# Patient Record
Sex: Female | Born: 1980 | Race: Black or African American | Hispanic: No | Marital: Single | Smoking: Current every day smoker
Health system: Southern US, Community
[De-identification: ages and names within clinical notes are randomized; demographics above are authoritative.]

## PROBLEM LIST (undated history)

## (undated) ENCOUNTER — Inpatient Hospital Stay (HOSPITAL_COMMUNITY): Payer: Self-pay

## (undated) DIAGNOSIS — R0602 Shortness of breath: Secondary | ICD-10-CM

## (undated) DIAGNOSIS — R87619 Unspecified abnormal cytological findings in specimens from cervix uteri: Secondary | ICD-10-CM

## (undated) DIAGNOSIS — A599 Trichomoniasis, unspecified: Secondary | ICD-10-CM

## (undated) DIAGNOSIS — Z87440 Personal history of urinary (tract) infections: Secondary | ICD-10-CM

## (undated) DIAGNOSIS — IMO0002 Reserved for concepts with insufficient information to code with codable children: Secondary | ICD-10-CM

## (undated) DIAGNOSIS — F32A Depression, unspecified: Secondary | ICD-10-CM

## (undated) DIAGNOSIS — O24919 Unspecified diabetes mellitus in pregnancy, unspecified trimester: Secondary | ICD-10-CM

## (undated) DIAGNOSIS — F329 Major depressive disorder, single episode, unspecified: Secondary | ICD-10-CM

---
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
## 2001-09-16 ENCOUNTER — Emergency Department (HOSPITAL_COMMUNITY): Admission: EM | Admit: 2001-09-16 | Discharge: 2001-09-16 | Payer: Self-pay | Admitting: Emergency Medicine

## 2001-09-17 ENCOUNTER — Inpatient Hospital Stay (HOSPITAL_COMMUNITY): Admission: AD | Admit: 2001-09-17 | Discharge: 2001-09-17 | Payer: Self-pay | Admitting: Obstetrics and Gynecology

## 2001-09-20 ENCOUNTER — Inpatient Hospital Stay (HOSPITAL_COMMUNITY): Admission: AD | Admit: 2001-09-20 | Discharge: 2001-09-20 | Payer: Self-pay | Admitting: *Deleted

## 2001-09-27 ENCOUNTER — Observation Stay (HOSPITAL_COMMUNITY): Admission: AD | Admit: 2001-09-27 | Discharge: 2001-09-28 | Payer: Self-pay | Admitting: *Deleted

## 2001-09-27 ENCOUNTER — Encounter: Payer: Self-pay | Admitting: *Deleted

## 2001-10-09 ENCOUNTER — Inpatient Hospital Stay (HOSPITAL_COMMUNITY): Admission: AD | Admit: 2001-10-09 | Discharge: 2001-10-09 | Payer: Self-pay | Admitting: Obstetrics and Gynecology

## 2001-12-22 ENCOUNTER — Ambulatory Visit (HOSPITAL_COMMUNITY): Admission: RE | Admit: 2001-12-22 | Discharge: 2001-12-22 | Payer: Self-pay | Admitting: *Deleted

## 2002-01-16 ENCOUNTER — Ambulatory Visit (HOSPITAL_COMMUNITY): Admission: RE | Admit: 2002-01-16 | Discharge: 2002-01-16 | Payer: Self-pay | Admitting: *Deleted

## 2002-04-16 ENCOUNTER — Inpatient Hospital Stay (HOSPITAL_COMMUNITY): Admission: AD | Admit: 2002-04-16 | Discharge: 2002-04-16 | Payer: Self-pay | Admitting: *Deleted

## 2002-05-10 ENCOUNTER — Inpatient Hospital Stay (HOSPITAL_COMMUNITY): Admission: AD | Admit: 2002-05-10 | Discharge: 2002-05-10 | Payer: Self-pay | Admitting: Family Medicine

## 2002-05-16 ENCOUNTER — Inpatient Hospital Stay (HOSPITAL_COMMUNITY): Admission: AD | Admit: 2002-05-16 | Discharge: 2002-05-16 | Payer: Self-pay | Admitting: Family Medicine

## 2002-05-16 ENCOUNTER — Inpatient Hospital Stay (HOSPITAL_COMMUNITY): Admission: AD | Admit: 2002-05-16 | Discharge: 2002-05-19 | Payer: Self-pay | Admitting: *Deleted

## 2002-05-17 ENCOUNTER — Encounter (INDEPENDENT_AMBULATORY_CARE_PROVIDER_SITE_OTHER): Payer: Self-pay | Admitting: Specialist

## 2002-08-20 ENCOUNTER — Emergency Department (HOSPITAL_COMMUNITY): Admission: EM | Admit: 2002-08-20 | Discharge: 2002-08-20 | Payer: Self-pay | Admitting: Emergency Medicine

## 2002-09-07 ENCOUNTER — Inpatient Hospital Stay (HOSPITAL_COMMUNITY): Admission: AD | Admit: 2002-09-07 | Discharge: 2002-09-07 | Payer: Self-pay | Admitting: *Deleted

## 2002-09-12 ENCOUNTER — Inpatient Hospital Stay (HOSPITAL_COMMUNITY): Admission: AD | Admit: 2002-09-12 | Discharge: 2002-09-12 | Payer: Self-pay | Admitting: *Deleted

## 2003-05-27 ENCOUNTER — Emergency Department (HOSPITAL_COMMUNITY): Admission: EM | Admit: 2003-05-27 | Discharge: 2003-05-27 | Payer: Self-pay | Admitting: Emergency Medicine

## 2003-07-25 ENCOUNTER — Emergency Department (HOSPITAL_COMMUNITY): Admission: EM | Admit: 2003-07-25 | Discharge: 2003-07-25 | Payer: Self-pay | Admitting: *Deleted

## 2003-08-27 ENCOUNTER — Inpatient Hospital Stay (HOSPITAL_COMMUNITY): Admission: AD | Admit: 2003-08-27 | Discharge: 2003-08-27 | Payer: Self-pay | Admitting: *Deleted

## 2003-10-19 ENCOUNTER — Emergency Department (HOSPITAL_COMMUNITY): Admission: EM | Admit: 2003-10-19 | Discharge: 2003-10-19 | Payer: Self-pay | Admitting: Family Medicine

## 2004-03-13 ENCOUNTER — Inpatient Hospital Stay (HOSPITAL_COMMUNITY): Admission: AD | Admit: 2004-03-13 | Discharge: 2004-03-13 | Payer: Self-pay | Admitting: Obstetrics and Gynecology

## 2004-04-21 ENCOUNTER — Emergency Department (HOSPITAL_COMMUNITY): Admission: EM | Admit: 2004-04-21 | Discharge: 2004-04-21 | Payer: Self-pay | Admitting: Family Medicine

## 2004-09-12 ENCOUNTER — Emergency Department (HOSPITAL_COMMUNITY): Admission: EM | Admit: 2004-09-12 | Discharge: 2004-09-12 | Payer: Self-pay | Admitting: Emergency Medicine

## 2004-10-14 ENCOUNTER — Emergency Department (HOSPITAL_COMMUNITY): Admission: EM | Admit: 2004-10-14 | Discharge: 2004-10-14 | Payer: Self-pay | Admitting: Emergency Medicine

## 2005-02-05 ENCOUNTER — Emergency Department (HOSPITAL_COMMUNITY): Admission: EM | Admit: 2005-02-05 | Discharge: 2005-02-05 | Payer: Self-pay | Admitting: Emergency Medicine

## 2005-07-25 ENCOUNTER — Emergency Department (HOSPITAL_COMMUNITY): Admission: EM | Admit: 2005-07-25 | Discharge: 2005-07-25 | Payer: Self-pay | Admitting: Emergency Medicine

## 2005-12-21 ENCOUNTER — Emergency Department (HOSPITAL_COMMUNITY): Admission: EM | Admit: 2005-12-21 | Discharge: 2005-12-21 | Payer: Self-pay | Admitting: *Deleted

## 2006-04-29 ENCOUNTER — Emergency Department (HOSPITAL_COMMUNITY): Admission: EM | Admit: 2006-04-29 | Discharge: 2006-04-29 | Payer: Self-pay | Admitting: Emergency Medicine

## 2006-10-25 ENCOUNTER — Emergency Department (HOSPITAL_COMMUNITY): Admission: EM | Admit: 2006-10-25 | Discharge: 2006-10-25 | Payer: Self-pay | Admitting: Emergency Medicine

## 2007-04-06 ENCOUNTER — Emergency Department (HOSPITAL_COMMUNITY): Admission: EM | Admit: 2007-04-06 | Discharge: 2007-04-06 | Payer: Self-pay | Admitting: Emergency Medicine

## 2007-04-17 DIAGNOSIS — O24919 Unspecified diabetes mellitus in pregnancy, unspecified trimester: Secondary | ICD-10-CM

## 2007-04-17 HISTORY — DX: Unspecified diabetes mellitus in pregnancy, unspecified trimester: O24.919

## 2007-04-20 ENCOUNTER — Inpatient Hospital Stay (HOSPITAL_COMMUNITY): Admission: AD | Admit: 2007-04-20 | Discharge: 2007-04-20 | Payer: Self-pay | Admitting: Obstetrics and Gynecology

## 2007-06-06 ENCOUNTER — Inpatient Hospital Stay (HOSPITAL_COMMUNITY): Admission: AD | Admit: 2007-06-06 | Discharge: 2007-06-06 | Payer: Self-pay | Admitting: Obstetrics & Gynecology

## 2007-06-18 ENCOUNTER — Ambulatory Visit (HOSPITAL_COMMUNITY): Admission: RE | Admit: 2007-06-18 | Discharge: 2007-06-18 | Payer: Self-pay | Admitting: Obstetrics and Gynecology

## 2007-08-28 ENCOUNTER — Ambulatory Visit (HOSPITAL_COMMUNITY): Admission: RE | Admit: 2007-08-28 | Discharge: 2007-08-28 | Payer: Self-pay | Admitting: Family Medicine

## 2007-08-31 ENCOUNTER — Ambulatory Visit: Payer: Self-pay | Admitting: Obstetrics and Gynecology

## 2007-08-31 ENCOUNTER — Inpatient Hospital Stay (HOSPITAL_COMMUNITY): Admission: AD | Admit: 2007-08-31 | Discharge: 2007-08-31 | Payer: Self-pay | Admitting: Obstetrics and Gynecology

## 2007-10-30 ENCOUNTER — Ambulatory Visit (HOSPITAL_COMMUNITY): Admission: RE | Admit: 2007-10-30 | Discharge: 2007-10-30 | Payer: Self-pay | Admitting: Family Medicine

## 2007-11-13 ENCOUNTER — Ambulatory Visit (HOSPITAL_COMMUNITY): Admission: RE | Admit: 2007-11-13 | Discharge: 2007-11-13 | Payer: Self-pay | Admitting: Obstetrics & Gynecology

## 2007-11-17 ENCOUNTER — Inpatient Hospital Stay (HOSPITAL_COMMUNITY): Admission: RE | Admit: 2007-11-17 | Discharge: 2007-11-20 | Payer: Self-pay | Admitting: Family Medicine

## 2007-11-17 ENCOUNTER — Ambulatory Visit: Payer: Self-pay | Admitting: Family Medicine

## 2008-04-26 ENCOUNTER — Emergency Department (HOSPITAL_COMMUNITY): Admission: EM | Admit: 2008-04-26 | Discharge: 2008-04-26 | Payer: Self-pay | Admitting: Emergency Medicine

## 2008-09-10 ENCOUNTER — Emergency Department (HOSPITAL_COMMUNITY): Admission: EM | Admit: 2008-09-10 | Discharge: 2008-09-10 | Payer: Self-pay | Admitting: Emergency Medicine

## 2009-03-27 ENCOUNTER — Emergency Department (HOSPITAL_COMMUNITY): Admission: EM | Admit: 2009-03-27 | Discharge: 2009-03-27 | Payer: Self-pay | Admitting: Emergency Medicine

## 2009-05-12 ENCOUNTER — Inpatient Hospital Stay (HOSPITAL_COMMUNITY): Admission: AD | Admit: 2009-05-12 | Discharge: 2009-05-12 | Payer: Self-pay | Admitting: Obstetrics & Gynecology

## 2009-05-12 ENCOUNTER — Ambulatory Visit: Payer: Self-pay | Admitting: Obstetrics & Gynecology

## 2009-05-14 ENCOUNTER — Emergency Department (HOSPITAL_COMMUNITY): Admission: EM | Admit: 2009-05-14 | Discharge: 2009-05-14 | Payer: Self-pay | Admitting: Emergency Medicine

## 2010-02-03 ENCOUNTER — Emergency Department (HOSPITAL_COMMUNITY): Admission: EM | Admit: 2010-02-03 | Discharge: 2010-02-03 | Payer: Self-pay | Admitting: Emergency Medicine

## 2010-05-03 ENCOUNTER — Inpatient Hospital Stay (HOSPITAL_COMMUNITY)
Admission: AD | Admit: 2010-05-03 | Discharge: 2010-05-03 | Payer: Self-pay | Source: Home / Self Care | Attending: Obstetrics and Gynecology | Admitting: Obstetrics and Gynecology

## 2010-05-08 LAB — URINALYSIS, ROUTINE W REFLEX MICROSCOPIC
Hgb urine dipstick: NEGATIVE
Nitrite: NEGATIVE
Protein, ur: NEGATIVE mg/dL
Specific Gravity, Urine: >1.03 — ABNORMAL HIGH (ref 1.005–1.030)
Urobilinogen, UA: 1 mg/dL (ref 0.0–1.0)

## 2010-05-08 LAB — URINE MICROSCOPIC-ADD ON

## 2010-06-28 LAB — DIFFERENTIAL
Basophils Relative: 0 % (ref 0–1)
Eosinophils Absolute: 0 10*3/uL (ref 0.0–0.7)
Eosinophils Relative: 0 % (ref 0–5)
Monocytes Absolute: 0.7 10*3/uL (ref 0.1–1.0)
Monocytes Relative: 7 % (ref 3–12)
Neutrophils Relative %: 77 % (ref 43–77)

## 2010-06-28 LAB — URINALYSIS, ROUTINE W REFLEX MICROSCOPIC
Nitrite: NEGATIVE
Protein, ur: NEGATIVE mg/dL
Specific Gravity, Urine: 1.019 (ref 1.005–1.030)
Urobilinogen, UA: 2 mg/dL — ABNORMAL HIGH (ref 0.0–1.0)
pH: 6.5 (ref 5.0–8.0)

## 2010-06-28 LAB — COMPREHENSIVE METABOLIC PANEL
ALT: 40 U/L — ABNORMAL HIGH (ref 0–35)
Albumin: 3.5 g/dL (ref 3.5–5.2)
Alkaline Phosphatase: 70 U/L (ref 39–117)
Glucose, Bld: 96 mg/dL (ref 70–99)
Potassium: 3.3 mEq/L — ABNORMAL LOW (ref 3.5–5.1)
Sodium: 138 mEq/L (ref 135–145)
Total Protein: 7.2 g/dL (ref 6.0–8.3)

## 2010-06-28 LAB — URINE CULTURE
Colony Count: >=100000
Culture  Setup Time: 201110211433

## 2010-06-28 LAB — WET PREP, GENITAL

## 2010-06-28 LAB — CBC
HCT: 36.8 % (ref 36.0–46.0)
RDW: 12.2 % (ref 11.5–15.5)
WBC: 10.3 10*3/uL (ref 4.0–10.5)

## 2010-06-28 LAB — URINE MICROSCOPIC-ADD ON

## 2010-06-28 LAB — GC/CHLAMYDIA PROBE AMP, GENITAL: Chlamydia, DNA Probe: NEGATIVE

## 2010-07-02 LAB — URINALYSIS, ROUTINE W REFLEX MICROSCOPIC
Bilirubin Urine: NEGATIVE
Bilirubin Urine: NEGATIVE
Glucose, UA: NEGATIVE mg/dL
Hgb urine dipstick: NEGATIVE
Ketones, ur: NEGATIVE mg/dL
Protein, ur: NEGATIVE mg/dL
Protein, ur: NEGATIVE mg/dL
Urobilinogen, UA: 0.2 mg/dL (ref 0.0–1.0)
Urobilinogen, UA: 0.2 mg/dL (ref 0.0–1.0)

## 2010-07-02 LAB — COMPREHENSIVE METABOLIC PANEL
AST: 22 U/L (ref 0–37)
Albumin: 3.6 g/dL (ref 3.5–5.2)
Alkaline Phosphatase: 65 U/L (ref 39–117)
BUN: 10 mg/dL (ref 6–23)
CO2: 28 mEq/L (ref 19–32)
Chloride: 102 mEq/L (ref 96–112)
Creatinine, Ser: 0.7 mg/dL (ref 0.4–1.2)
GFR calc Af Amer: >60 mL/min (ref >60)
GFR calc non Af Amer: >60 mL/min (ref >60)
Potassium: 4.4 mEq/L (ref 3.5–5.1)
Total Bilirubin: 0.7 mg/dL (ref 0.3–1.2)

## 2010-07-02 LAB — LIPASE, BLOOD: Lipase: 25 U/L (ref 11–59)

## 2010-07-02 LAB — CBC
Hemoglobin: 12.2 g/dL (ref 12.0–15.0)
MCHC: 34 g/dL (ref 30.0–36.0)
MCV: 95.7 fL (ref 78.0–100.0)
RDW: 12.5 % (ref 11.5–15.5)

## 2010-07-02 LAB — URINE MICROSCOPIC-ADD ON

## 2010-07-02 LAB — DIFFERENTIAL
Basophils Absolute: 0 10*3/uL (ref 0.0–0.1)
Basophils Relative: 0 % (ref 0–1)
Eosinophils Relative: 0 % (ref 0–5)
Lymphocytes Relative: 22 % (ref 12–46)
Monocytes Absolute: 0.6 10*3/uL (ref 0.1–1.0)

## 2010-07-02 LAB — GC/CHLAMYDIA PROBE AMP, GENITAL
Chlamydia, DNA Probe: POSITIVE — AB
GC Probe Amp, Genital: POSITIVE — AB

## 2010-07-02 LAB — WET PREP, GENITAL
Clue Cells Wet Prep HPF POC: NONE SEEN
Trich, Wet Prep: NONE SEEN

## 2010-07-18 LAB — URINE CULTURE: Colony Count: 80000

## 2010-07-18 LAB — URINALYSIS, ROUTINE W REFLEX MICROSCOPIC
Specific Gravity, Urine: 1.031 — ABNORMAL HIGH (ref 1.005–1.030)
pH: 5.5 (ref 5.0–8.0)

## 2010-07-18 LAB — POCT I-STAT, CHEM 8
Calcium, Ion: 1.15 mmol/L (ref 1.12–1.32)
Chloride: 107 mEq/L (ref 96–112)
Creatinine, Ser: 0.8 mg/dL (ref 0.4–1.2)
Glucose, Bld: 99 mg/dL (ref 70–99)
HCT: 49 % — ABNORMAL HIGH (ref 36.0–46.0)

## 2010-07-18 LAB — URINE MICROSCOPIC-ADD ON

## 2010-07-31 LAB — POCT PREGNANCY, URINE: Preg Test, Ur: POSITIVE

## 2010-07-31 LAB — DIFFERENTIAL
Basophils Relative: 1 % (ref 0–1)
Eosinophils Absolute: 0 10*3/uL (ref 0.0–0.7)
Lymphs Abs: 2.2 10*3/uL (ref 0.7–4.0)
Monocytes Absolute: 0.5 10*3/uL (ref 0.1–1.0)
Monocytes Relative: 3 % (ref 3–12)
Neutro Abs: 13.2 10*3/uL — ABNORMAL HIGH (ref 1.7–7.7)

## 2010-07-31 LAB — URINALYSIS, ROUTINE W REFLEX MICROSCOPIC
Glucose, UA: NEGATIVE mg/dL
Specific Gravity, Urine: 1.027 (ref 1.005–1.030)
Urobilinogen, UA: 1 mg/dL (ref 0.0–1.0)

## 2010-07-31 LAB — COMPREHENSIVE METABOLIC PANEL
ALT: 12 U/L (ref 0–35)
Albumin: 3.8 g/dL (ref 3.5–5.2)
Alkaline Phosphatase: 60 U/L (ref 39–117)
Calcium: 9.7 mg/dL (ref 8.4–10.5)
GFR calc Af Amer: >60 mL/min (ref >60)
Potassium: 3.6 mEq/L (ref 3.5–5.1)
Sodium: 137 mEq/L (ref 135–145)
Total Protein: 7 g/dL (ref 6.0–8.3)

## 2010-07-31 LAB — GC/CHLAMYDIA PROBE AMP, GENITAL
Chlamydia, DNA Probe: NEGATIVE
GC Probe Amp, Genital: POSITIVE — AB

## 2010-07-31 LAB — URINE MICROSCOPIC-ADD ON

## 2010-07-31 LAB — CBC
MCHC: 34 g/dL (ref 30.0–36.0)
Platelets: 332 10*3/uL (ref 150–400)
RDW: 13.1 % (ref 11.5–15.5)

## 2010-07-31 LAB — WET PREP, GENITAL: Trich, Wet Prep: NONE SEEN

## 2010-07-31 LAB — URINE CULTURE: Colony Count: >=100000

## 2010-08-29 NOTE — Discharge Summary (Signed)
NAMERAISA, DITTO            ACCOUNT NO.:  [id number]   MEDICAL RECORD NO.:  [id number]          PATIENT TYPE:  INP   LOCATION:  9128                          FACILITY:  [facility]   PHYSICIAN:  Norton Blizzard, MD    DATE OF BIRTH:  03/31/1981   DATE OF ADMISSION:  11/17/2007  DATE OF DISCHARGE:  11/20/2007                               DISCHARGE SUMMARY   ADMISSION DIAGNOSES:  1. Term pregnancy, singleton gestation.  2. Elective primary low-transverse cesarean section due to large for      gestational age baby.   DISCHARGE DIAGNOSES:  1. Term pregnancy, singleton gestation.  2. Elective primary low-transverse cesarean section due to large for      gestational age baby.   REASON FOR HOSPITALIZATION:  Ms. Litsy Epting is a 30 year old  gravida 4, para 2-0-2-2 who is admitted at 27 weeks and 3/7 days of  gestational age for an elective primary low-transverse C-section  secondary to an estimated fetal weight of 4800 g.  She was previously  extensively counseled on the risks and benefits of a primary low-  transverse C-section versus a vaginal delivery and the patient elected  for delivery by cesarean section.  She had an uncomplicated primary low-  transverse cesarean section on November 17, 2007, with the birth of a  healthy viable female infant weighing 9 pounds 14 ounces.  Surgery was  uncomplicated.  Ms. Markiewicz went to the PACU in stable condition.  Following a short stay in the PACU, she went to the mother baby ward  where her postpartum course was routine and uncomplicated.  Postoperative day #3, she was ambulating without difficulty.  Her pain  was controlled with ibuprofen and occasional Percocet, she was  tolerating p.o., she had no bowel movement, but was passing flatus, and  her lochia was minimal.  Her incision site was clean, dry, and intact,  and did not have any erythema.   PROCEDURE PERFORMED:  Primary low-transverse cesarean section.   CONDITION OF PATIENT AT  DISCHARGE:  Good.   DISCHARGE INSTRUCTIONS:  Patient will have the staples removed by baby  love nurse between postop day #5 and #7.  She needs [facility] weeks for a postpartum exam, which includes  Pap  smear.  The routine post cesarean instructions were given regarding  activity level, medications, diet, and wound care.  For pain medication,  she was given ibuprofen and Percocet, and she elected to use Micronor  for birth control, she was given a prescription for this as well.      Odie Sera, DO  Electronically Signed     ______________________________  Norton Blizzard, MD    MC/MEDQ  D:  11/20/2007  T:  11/21/2007  Job:  [id number]

## 2010-08-29 NOTE — Op Note (Signed)
Colleen Russell, Colleen Russell            ACCOUNT NO.:  [id number]   MEDICAL RECORD NO.:  [id number]          PATIENT TYPE:  INP   LOCATION:  9128                          FACILITY:  [facility]   PHYSICIAN:  Tanya S. Shawnie Pons, M.D.   DATE OF BIRTH:  Jul 08, 1980   DATE OF PROCEDURE:  11/17/2007  DATE OF DISCHARGE:                               OPERATIVE REPORT   PREOPERATIVE DIAGNOSES:  1. Intrauterine pregnancy at 40 weeks, large for gestational age.  2. Primary low transverse cesarean section.   POSTOPERATIVE DIAGNOSES:  1. Intrauterine pregnancy at 40 weeks, large for gestational age.  2. Primary low transverse cesarean section.   PROCEDURE:  Primary low-transverse cesarean section.   INDICATIONS FOR PROCEDURE:  The patient is a 30 year old, gravida 4,  para 1-0-2-1 at 60 plus weeks' gestational age with an estimated fetal  weight of 4800 grams by ultrasound.  The patient was counseled on the  risks and benefits of vaginal verus cesarean section for an infant that  large as well as the possibility that weight could be up to 500 grams  off in either direction.  The patient elects for a primary low  transverse C-section as a method of delivery.   SURGEON:  Shelbie Proctor. Shawnie Pons, MD   ASSISTANT:  Odie Sera, DO   ANESTHESIA:  Spinal and local.   FINDINGS:  1. A viable female infant with Apgar's of 8 at one minute and 9 at five      minutes, 9 pounds 14 ounces.  There was body cord noted upon      delivery of the infant.  2. Intact placenta with 3-vessel cord.   SPECIMENS:  Placenta to Labor and Delivery.  The cord blood was  collected by the cord blood donation team.   ESTIMATED BLOOD LOSS:  1000 mL.   COMPLICATIONS:  None immediately.   REASON FOR PROCEDURE:  Infant at 4800gms by ultrasound.  Counseled about  risks and benefits of C-section, inaccuracy of ultrasound and patient  desired primary cesearean delivery.   OPERATIVE PROCEDURE:  The patient was prepped and draped in the usual  fashion and placed in a left lateral supine position.  Betadine solution  was used for antiseptic and the patient was catheterized prior to the  procedure.  After adequate spinal anesthesia was confirmed, a  Pfannenstiel incision was made.  The abdomen was opened.  The incision  was extended down through the fascia.  The peritoneum was bluntly  entered.  The lower uterine segment was incised transversely.  Clear  amniotic fluid was noted and a viable baby was delivered through vertex  presentation.  The baby was bulb suctioned and had a spontaneous cry.  Cord was clamped x2 and cut and the infant was given to the pediatric  staff.  The uterus, bilateral fallopian tubes, fimbria and ovaries were  inspected and all were found to be grossly normal.  The uterine  musculature was closed in 2 layers with 0 Vicryl running.  The first  layer was closed with interlocking suture and the second was not.  Bleeding points were hemostatically checked.  Closure of  the fascia was  done with 0 Vicryl running.  The skin was closed with staples.  Instrument, needle, and sponge counts were correct x2.  The patient  tolerated the procedure well and returned to the postanesthesia recovery  unit in good condition.      Odie Sera, DO  Electronically Signed     ______________________________  Shelbie Proctor. Shawnie Pons, M.D.    MC/MEDQ  D:  11/17/2007  T:  11/18/2007  Job:  [id number]

## 2010-09-28 ENCOUNTER — Emergency Department (HOSPITAL_COMMUNITY)
Admission: EM | Admit: 2010-09-28 | Discharge: 2010-09-28 | Disposition: A | Discharge status: Home / Self Care | Payer: Medicaid Other | Attending: Emergency Medicine | Admitting: Emergency Medicine

## 2010-09-28 ENCOUNTER — Emergency Department (HOSPITAL_COMMUNITY): Payer: Medicaid Other

## 2010-09-28 DIAGNOSIS — S0990XA Unspecified injury of head, initial encounter: Secondary | ICD-10-CM | POA: Insufficient documentation

## 2010-09-28 DIAGNOSIS — R51 Headache: Secondary | ICD-10-CM | POA: Insufficient documentation

## 2010-09-28 DIAGNOSIS — R110 Nausea: Secondary | ICD-10-CM | POA: Insufficient documentation

## 2010-09-28 DIAGNOSIS — H538 Other visual disturbances: Secondary | ICD-10-CM | POA: Insufficient documentation

## 2010-09-28 DIAGNOSIS — F411 Generalized anxiety disorder: Secondary | ICD-10-CM | POA: Insufficient documentation

## 2010-09-28 DIAGNOSIS — S0003XA Contusion of scalp, initial encounter: Secondary | ICD-10-CM | POA: Insufficient documentation

## 2010-12-01 ENCOUNTER — Inpatient Hospital Stay (INDEPENDENT_AMBULATORY_CARE_PROVIDER_SITE_OTHER)
Admission: RE | Admit: 2010-12-01 | Discharge: 2010-12-01 | Disposition: A | Discharge status: Home / Self Care | Payer: Medicaid Other | Source: Ambulatory Visit | Attending: Emergency Medicine | Admitting: Emergency Medicine

## 2010-12-01 DIAGNOSIS — G5600 Carpal tunnel syndrome, unspecified upper limb: Secondary | ICD-10-CM

## 2011-01-03 LAB — URINALYSIS, ROUTINE W REFLEX MICROSCOPIC
Glucose, UA: NEGATIVE
Hgb urine dipstick: NEGATIVE
Protein, ur: NEGATIVE
pH: 6.5

## 2011-01-03 LAB — CBC
HCT: 38.8
Hemoglobin: 13.8
MCV: 95.8
RBC: 4.05
WBC: 9

## 2011-01-03 LAB — URINE MICROSCOPIC-ADD ON

## 2011-01-03 LAB — POCT PREGNANCY, URINE: Preg Test, Ur: POSITIVE

## 2011-01-03 LAB — WET PREP, GENITAL

## 2011-01-03 LAB — GC/CHLAMYDIA PROBE AMP, GENITAL
Chlamydia, DNA Probe: NEGATIVE
GC Probe Amp, Genital: NEGATIVE

## 2011-01-05 LAB — URINALYSIS, ROUTINE W REFLEX MICROSCOPIC
Bilirubin Urine: NEGATIVE
Ketones, ur: NEGATIVE
Nitrite: NEGATIVE
Protein, ur: NEGATIVE

## 2011-01-10 LAB — URINALYSIS, ROUTINE W REFLEX MICROSCOPIC
Bilirubin Urine: NEGATIVE
Ketones, ur: NEGATIVE
Nitrite: NEGATIVE
Urobilinogen, UA: 0.2

## 2011-01-10 LAB — FETAL FIBRONECTIN: Fetal Fibronectin: NEGATIVE

## 2011-01-10 LAB — URINE MICROSCOPIC-ADD ON

## 2011-01-12 LAB — CROSSMATCH: Antibody Screen: NEGATIVE

## 2011-01-12 LAB — CBC
HCT: 34.7 — ABNORMAL LOW
MCV: 94.8
MCV: 94.9
Platelets: 260
Platelets: 200
RBC: 3.66 — ABNORMAL LOW
RBC: 2.98 — ABNORMAL LOW
WBC: 7.3
WBC: 7.2

## 2011-01-12 LAB — RPR: RPR Ser Ql: NONREACTIVE

## 2011-01-19 LAB — URINALYSIS, ROUTINE W REFLEX MICROSCOPIC
Hgb urine dipstick: NEGATIVE
Nitrite: NEGATIVE
Protein, ur: >300 — AB
Specific Gravity, Urine: 1.024
Urobilinogen, UA: 1

## 2011-01-19 LAB — URINE CULTURE: Colony Count: >=100000

## 2011-01-19 LAB — POCT PREGNANCY, URINE
Operator id: 284251
Preg Test, Ur: POSITIVE

## 2011-01-19 LAB — URINE MICROSCOPIC-ADD ON

## 2011-02-14 ENCOUNTER — Inpatient Hospital Stay (HOSPITAL_COMMUNITY): Payer: Medicaid Other

## 2011-02-14 ENCOUNTER — Inpatient Hospital Stay (HOSPITAL_COMMUNITY)
Admission: AD | Admit: 2011-02-14 | Discharge: 2011-02-14 | Disposition: A | Discharge status: Home / Self Care | Payer: Medicaid Other | Source: Ambulatory Visit | Attending: Obstetrics & Gynecology | Admitting: Obstetrics & Gynecology

## 2011-02-14 ENCOUNTER — Encounter (HOSPITAL_COMMUNITY): Payer: Self-pay | Admitting: *Deleted

## 2011-02-14 DIAGNOSIS — Z1389 Encounter for screening for other disorder: Secondary | ICD-10-CM

## 2011-02-14 DIAGNOSIS — A5901 Trichomonal vulvovaginitis: Secondary | ICD-10-CM | POA: Insufficient documentation

## 2011-02-14 DIAGNOSIS — R109 Unspecified abdominal pain: Secondary | ICD-10-CM | POA: Insufficient documentation

## 2011-02-14 DIAGNOSIS — A599 Trichomoniasis, unspecified: Secondary | ICD-10-CM

## 2011-02-14 DIAGNOSIS — Z3490 Encounter for supervision of normal pregnancy, unspecified, unspecified trimester: Secondary | ICD-10-CM

## 2011-02-14 HISTORY — DX: Reserved for concepts with insufficient information to code with codable children: IMO0002

## 2011-02-14 HISTORY — DX: Trichomoniasis, unspecified: A59.9

## 2011-02-14 HISTORY — DX: Personal history of urinary (tract) infections: Z87.440

## 2011-02-14 HISTORY — DX: Unspecified abnormal cytological findings in specimens from cervix uteri: R87.619

## 2011-02-14 LAB — URINALYSIS, ROUTINE W REFLEX MICROSCOPIC
Hgb urine dipstick: NEGATIVE
Nitrite: NEGATIVE
Protein, ur: NEGATIVE mg/dL
Specific Gravity, Urine: 1.01 (ref 1.005–1.030)
Urobilinogen, UA: 2 mg/dL — ABNORMAL HIGH (ref 0.0–1.0)

## 2011-02-14 LAB — URINE MICROSCOPIC-ADD ON

## 2011-02-14 LAB — WET PREP, GENITAL: Yeast Wet Prep HPF POC: NONE SEEN

## 2011-02-14 LAB — POCT PREGNANCY, URINE: Preg Test, Ur: POSITIVE

## 2011-02-14 LAB — CBC
HCT: 38.2 % (ref 36.0–46.0)
Hemoglobin: 13.3 g/dL (ref 12.0–15.0)
MCHC: 34.8 g/dL (ref 30.0–36.0)
MCV: 92.5 fL (ref 78.0–100.0)
WBC: 8.1 10*3/uL (ref 4.0–10.5)

## 2011-02-14 MED ORDER — PROMETHAZINE HCL 25 MG PO TABS: 25.0000 mg | ORAL_TABLET | Freq: Four times a day (QID) | ORAL | Status: AC | PRN

## 2011-02-14 MED ORDER — METRONIDAZOLE 500 MG PO TABS: 500.0000 mg | ORAL_TABLET | Freq: Two times a day (BID) | ORAL | Status: AC
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
## 2011-02-15 LAB — GC/CHLAMYDIA PROBE AMP, GENITAL
Chlamydia, DNA Probe: NEGATIVE
GC Probe Amp, Genital: NEGATIVE

## 2011-03-19 ENCOUNTER — Other Ambulatory Visit (HOSPITAL_COMMUNITY): Payer: Self-pay | Admitting: Physician Assistant

## 2011-03-19 DIAGNOSIS — Z3689 Encounter for other specified antenatal screening: Secondary | ICD-10-CM

## 2011-03-19 LAB — OB RESULTS CONSOLE ABO/RH: RH Type: POSITIVE

## 2011-03-19 LAB — OB RESULTS CONSOLE HIV ANTIBODY (ROUTINE TESTING): HIV: NONREACTIVE

## 2011-03-29 LAB — OB RESULTS CONSOLE HIV ANTIBODY (ROUTINE TESTING): HIV: NONREACTIVE

## 2011-04-18 ENCOUNTER — Other Ambulatory Visit (HOSPITAL_COMMUNITY): Payer: Self-pay | Admitting: Physician Assistant

## 2011-04-18 ENCOUNTER — Ambulatory Visit (HOSPITAL_COMMUNITY)
Admission: RE | Admit: 2011-04-18 | Discharge: 2011-04-18 | Disposition: A | Discharge status: Home / Self Care | Payer: Medicaid Other | Source: Ambulatory Visit | Attending: Physician Assistant | Admitting: Physician Assistant

## 2011-04-18 DIAGNOSIS — Z3689 Encounter for other specified antenatal screening: Secondary | ICD-10-CM

## 2011-04-18 DIAGNOSIS — O99330 Smoking (tobacco) complicating pregnancy, unspecified trimester: Secondary | ICD-10-CM | POA: Insufficient documentation

## 2011-04-18 DIAGNOSIS — Z363 Encounter for antenatal screening for malformations: Secondary | ICD-10-CM | POA: Insufficient documentation

## 2011-04-18 DIAGNOSIS — Z1389 Encounter for screening for other disorder: Secondary | ICD-10-CM | POA: Insufficient documentation

## 2011-04-18 DIAGNOSIS — O34219 Maternal care for unspecified type scar from previous cesarean delivery: Secondary | ICD-10-CM | POA: Insufficient documentation

## 2011-04-18 DIAGNOSIS — O09299 Supervision of pregnancy with other poor reproductive or obstetric history, unspecified trimester: Secondary | ICD-10-CM | POA: Insufficient documentation

## 2011-04-18 DIAGNOSIS — O358XX0 Maternal care for other (suspected) fetal abnormality and damage, not applicable or unspecified: Secondary | ICD-10-CM | POA: Insufficient documentation

## 2011-05-02 ENCOUNTER — Ambulatory Visit (HOSPITAL_COMMUNITY)
Admission: RE | Admit: 2011-05-02 | Discharge: 2011-05-02 | Disposition: A | Discharge status: Home / Self Care | Payer: Medicaid Other | Source: Ambulatory Visit | Attending: Physician Assistant | Admitting: Physician Assistant

## 2011-05-02 DIAGNOSIS — O99330 Smoking (tobacco) complicating pregnancy, unspecified trimester: Secondary | ICD-10-CM | POA: Insufficient documentation

## 2011-05-02 DIAGNOSIS — O09299 Supervision of pregnancy with other poor reproductive or obstetric history, unspecified trimester: Secondary | ICD-10-CM | POA: Insufficient documentation

## 2011-05-02 DIAGNOSIS — Z3689 Encounter for other specified antenatal screening: Secondary | ICD-10-CM

## 2011-05-02 DIAGNOSIS — O34219 Maternal care for unspecified type scar from previous cesarean delivery: Secondary | ICD-10-CM | POA: Insufficient documentation

## 2011-08-13 ENCOUNTER — Encounter (HOSPITAL_COMMUNITY): Payer: Self-pay | Admitting: *Deleted

## 2011-08-13 ENCOUNTER — Observation Stay (HOSPITAL_COMMUNITY)
Admission: AD | Admit: 2011-08-13 | Discharge: 2011-08-14 | Disposition: A | Discharge status: Home / Self Care | Payer: Medicaid Other | Source: Ambulatory Visit | Attending: Obstetrics & Gynecology | Admitting: Obstetrics & Gynecology

## 2011-08-13 DIAGNOSIS — O9A213 Injury, poisoning and certain other consequences of external causes complicating pregnancy, third trimester: Secondary | ICD-10-CM | POA: Diagnosis present

## 2011-08-13 DIAGNOSIS — O99891 Other specified diseases and conditions complicating pregnancy: Secondary | ICD-10-CM | POA: Insufficient documentation

## 2011-08-13 DIAGNOSIS — W010XXA Fall on same level from slipping, tripping and stumbling without subsequent striking against object, initial encounter: Secondary | ICD-10-CM | POA: Insufficient documentation

## 2011-08-13 DIAGNOSIS — Z3490 Encounter for supervision of normal pregnancy, unspecified, unspecified trimester: Secondary | ICD-10-CM

## 2011-08-13 DIAGNOSIS — O4700 False labor before 37 completed weeks of gestation, unspecified trimester: Principal | ICD-10-CM | POA: Insufficient documentation

## 2011-08-13 DIAGNOSIS — O479 False labor, unspecified: Secondary | ICD-10-CM

## 2011-08-13 DIAGNOSIS — W19XXXA Unspecified fall, initial encounter: Secondary | ICD-10-CM

## 2011-08-13 MED ORDER — OXYCODONE-ACETAMINOPHEN 5-325 MG PO TABS
1.0000 | ORAL_TABLET | Freq: Once | ORAL | Status: AC
  Administered 2011-08-13: 1 via ORAL
  Filled 2011-08-13: qty 1

## 2011-08-13 MED ORDER — LACTATED RINGERS IV BOLUS (SEPSIS)
1000.0000 mL | Freq: Once | INTRAVENOUS | Status: AC
  Administered 2011-08-13: 1000 mL via INTRAVENOUS

## 2011-08-13 MED ORDER — ACETAMINOPHEN 325 MG PO TABS: 650.0000 mg | ORAL_TABLET | ORAL | Status: DC | PRN

## 2011-08-13 MED ORDER — PRENATAL MULTIVITAMIN CH
1.0000 | ORAL_TABLET | Freq: Every day | ORAL | Status: DC
  Administered 2011-08-13 – 2011-08-14 (×2): 1 via ORAL
  Filled 2011-08-13 (×4): qty 1

## 2011-08-13 MED ORDER — DOCUSATE SODIUM 100 MG PO CAPS
100.0000 mg | ORAL_CAPSULE | Freq: Every day | ORAL | Status: DC
  Administered 2011-08-13 – 2011-08-14 (×2): 100 mg via ORAL
  Filled 2011-08-13 (×4): qty 1

## 2011-08-13 MED ORDER — ZOLPIDEM TARTRATE 10 MG PO TABS: 10.0000 mg | ORAL_TABLET | Freq: Every evening | ORAL | Status: DC | PRN

## 2011-08-13 MED ORDER — CALCIUM CARBONATE ANTACID 500 MG PO CHEW
2.0000 | CHEWABLE_TABLET | ORAL | Status: DC | PRN
  Filled 2011-08-13: qty 2

## 2011-08-13 MED ORDER — CYCLOBENZAPRINE HCL 10 MG PO TABS
10.0000 mg | ORAL_TABLET | Freq: Once | ORAL | Status: AC
  Administered 2011-08-13: 10 mg via ORAL
  Filled 2011-08-13: qty 1

## 2011-08-13 NOTE — MAU Provider Note (Signed)
  History     CSN: [id number]  Arrival date and time: 08/13/11 1059   First Provider Initiated Contact with Patient 08/13/11 1439      Chief Complaint  Patient presents with  . Fall   HPI Bryce around 6:30 am this morning, slipped on water and "did the splits". No abdominal trauma. Hip, leg and low back pain, no abdominal pain, no bleeding or LOF. + fetal movement.   Past Medical History  Diagnosis Date  . History of bladder infections   . Abnormal Pap smear   . Trichomonas     Past Surgical History  Procedure Date  . Cesarean section     Family History  Problem Relation Age of Onset  . Hypertension Mother   . Diabetes Sister     History  Substance Use Topics  . Smoking status: Former Smoker    Quit date: 01/14/2011  . Smokeless tobacco: Never Used  . Alcohol Use: No    Allergies: No Known Allergies  Prescriptions prior to admission  Medication Sig Dispense Refill  . Prenatal Vit-Fe Fumarate-FA (PRENATAL MULTIVITAMIN) TABS Take 1 tablet by mouth every morning.        Review of Systems  Constitutional: Negative.   Respiratory: Negative.   Cardiovascular: Negative.   Gastrointestinal: Negative for nausea, vomiting, abdominal pain, diarrhea and constipation.  Genitourinary: Negative for dysuria, urgency, frequency, hematuria and flank pain.       Negative for vaginal bleeding, cramping/contractions  Musculoskeletal: Positive for back pain, joint pain and falls.  Neurological: Negative.   Psychiatric/Behavioral: Negative.    Physical Exam   Blood pressure 112/54, pulse 114, temperature 99.3 F (37.4 C), temperature source Oral, resp. rate 18, height 5' 3.5" (1.613 m), last menstrual period 12/18/2010, SpO2 100.00%.  Physical Exam  Nursing note and vitals reviewed. Constitutional: She is oriented to person, place, and time. She appears well-developed and well-nourished. No distress.  Cardiovascular: Normal rate.   Respiratory: Effort normal.  GI: Soft.  There is no tenderness.  Musculoskeletal: Normal range of motion. She exhibits tenderness (lumbar).  Neurological: She is alert and oriented to person, place, and time.  Skin: Skin is warm and dry.  Psychiatric: She has a normal mood and affect.   EFM: 140s, moderate variability, + accels, no decels x 4 hours TOCO: some irritability initially, then quiet, just prior to 4 hours into monitoring, pt had episode of uterine irritability and contractions  MAU Course  Procedures  Pt states back, hip and leg pain improved with percocet and flexeril, feeling some mild cramping  Assessment and Plan  31 y.o. [id number] at [redacted]w[redacted]d s/p fall with uterine activity Admit to antenatal for observation  Wolfe Camarena 08/13/2011, 3:24 PM

## 2011-08-13 NOTE — MAU Note (Signed)
Patient states she fell down 3 stairs this am at 0630 and when she hit the bottom she did a split on water. Is having low back pain and pain in both thighs. Has not felt the baby move since the fall. Denies any bleeding or leaking.

## 2011-08-14 DIAGNOSIS — Z3490 Encounter for supervision of normal pregnancy, unspecified, unspecified trimester: Secondary | ICD-10-CM

## 2011-08-14 DIAGNOSIS — O9A213 Injury, poisoning and certain other consequences of external causes complicating pregnancy, third trimester: Secondary | ICD-10-CM | POA: Diagnosis present

## 2011-08-14 NOTE — MAU Provider Note (Signed)
Agree with note. 

## 2011-08-14 NOTE — Discharge Summary (Signed)
Obstetric Discharge Summary Reason for Admission: observation/evaluation and fall at 35 weeks Prenatal Procedures: ultrasound Intrapartum Procedures: n/a Postpartum Procedures: not delivered Complications-Operative and Postpartum: n/a Hemoglobin  Date Value Range Status  02/14/2011 13.3  12.0-15.0 (g/dL) Final     HCT  Date Value Range Status  02/14/2011 38.2  36.0-46.0 (%) Final    Physical Exam:  General: alert, cooperative and no distress Lochia: n/a Uterine Fundus: gravid c/w dates Incision: n/a DVT Evaluation: No evidence of DVT seen on physical exam.  Discharge Diagnoses: Fall at 35 weeks, no abdominal trauma, reassuring fetal monitoring  Discharge Information: Date: 08/14/2011 Activity: unrestricted Diet: routine Medications: None Condition: improved Instructions: Trauma in pregnancy Discharge to: home Follow-up Information    Follow up with [facility] in 3 days.       PTL precautions given  Newborn Data: This patient has no babies on file. Home with baby undelivered.  Tequan Redmon 08/14/2011, 10:38 AM

## 2011-08-14 NOTE — Progress Notes (Signed)
UR Chart review completed.  

## 2011-08-14 NOTE — Discharge Instructions (Signed)
Injuries in Pregnancy Injuries can happen during pregnancy. Minor falls and accidents usually do not harm the mother or unborn baby. However, any injury should be reported to your doctor. HOME CARE  Do not take aspirin. It can make any bleeding you may have worse.   Put ice on the injured area.   Put ice in a plastic bag.   Place a towel between your skin and the bag.   Leave the ice on for 15 to 20 minutes, 3 to 4 times a day.   Put warm packs on the injured area after 24 hours if told by your doctor.   Have someone care for you and help you if needed.   Do not wear high heels while pregnant.   Remove rugs and loose objects on the floor.   Avoid fire or starting fires.   Avoid lifting heavy pots of boiling liquid.  GET HELP RIGHT AWAY IF:  You have been a victim of domestic violence.   You have been in a car accident.   You have more pain in any part of the body.   You have bleeding from your vagina.   Fluid is leaking from your vagina.   You start to have belly cramping (contractions) or pain.   You have a stiff neck or neck pain.   You feel weak or pass out (faint).   You start to throw up (vomit) after an injury.   You have been burned.   You get a headache or have vision problems after an injury.   You do not feel the baby move or the baby is not moving as much as normal.  MAKE SURE YOU:  Understand these instructions.   Will watch your condition.   Will get help right away if you are not doing well or get worse.  Document Released: 05/05/2010 Document Revised: 03/22/2011 Document Reviewed: 05/05/2010 [employer] Patient Information 2012 [employer]. 

## 2011-08-23 ENCOUNTER — Encounter (HOSPITAL_COMMUNITY): Payer: Self-pay

## 2011-08-27 ENCOUNTER — Other Ambulatory Visit (HOSPITAL_COMMUNITY): Payer: Self-pay | Admitting: Physician Assistant

## 2011-08-27 DIAGNOSIS — O26849 Uterine size-date discrepancy, unspecified trimester: Secondary | ICD-10-CM

## 2011-08-28 ENCOUNTER — Ambulatory Visit (HOSPITAL_COMMUNITY)
Admission: RE | Admit: 2011-08-28 | Discharge: 2011-08-28 | Disposition: A | Discharge status: Home / Self Care | Payer: Medicaid Other | Source: Ambulatory Visit | Attending: Physician Assistant | Admitting: Physician Assistant

## 2011-08-28 DIAGNOSIS — O26849 Uterine size-date discrepancy, unspecified trimester: Secondary | ICD-10-CM

## 2011-08-28 DIAGNOSIS — O09299 Supervision of pregnancy with other poor reproductive or obstetric history, unspecified trimester: Secondary | ICD-10-CM | POA: Insufficient documentation

## 2011-08-28 DIAGNOSIS — O99330 Smoking (tobacco) complicating pregnancy, unspecified trimester: Secondary | ICD-10-CM | POA: Insufficient documentation

## 2011-08-28 DIAGNOSIS — O34219 Maternal care for unspecified type scar from previous cesarean delivery: Secondary | ICD-10-CM | POA: Insufficient documentation

## 2011-08-28 DIAGNOSIS — O3660X0 Maternal care for excessive fetal growth, unspecified trimester, not applicable or unspecified: Secondary | ICD-10-CM | POA: Insufficient documentation

## 2011-09-06 ENCOUNTER — Encounter (HOSPITAL_COMMUNITY)
Admission: RE | Admit: 2011-09-06 | Discharge: 2011-09-06 | Disposition: A | Discharge status: Home / Self Care | Payer: Medicaid Other | Source: Ambulatory Visit | Attending: Obstetrics and Gynecology | Admitting: Obstetrics and Gynecology

## 2011-09-06 ENCOUNTER — Encounter (HOSPITAL_COMMUNITY): Payer: Self-pay

## 2011-09-06 HISTORY — DX: Unspecified diabetes mellitus in pregnancy, unspecified trimester: O24.919

## 2011-09-06 HISTORY — DX: Shortness of breath: R06.02

## 2011-09-06 HISTORY — DX: Major depressive disorder, single episode, unspecified: F32.9

## 2011-09-06 HISTORY — DX: Depression, unspecified: F32.A

## 2011-09-06 LAB — SURGICAL PCR SCREEN
MRSA, PCR: NEGATIVE
Staphylococcus aureus: NEGATIVE

## 2011-09-06 LAB — CBC
Hemoglobin: 11.5 g/dL — ABNORMAL LOW (ref 12.0–15.0)
MCHC: 33.7 g/dL (ref 30.0–36.0)
RBC: 3.66 MIL/uL — ABNORMAL LOW (ref 3.87–5.11)

## 2011-09-06 LAB — RPR: RPR Ser Ql: NONREACTIVE
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
## 2011-09-06 NOTE — Patient Instructions (Signed)
YOUR PROCEDURE IS SCHEDULED ON:09/11/11 ENTER THROUGH THE MAIN ENTRANCE OF [facility] AT:1pm  USE DESK PHONE AND DIAL [phone] TO INFORM us OF YOUR ARRIVAL  CALL [phone] IF YOU HAVE ANY QUESTIONS OR PROBLEMS PRIOR TO YOUR ARRIVAL.  REMEMBER: DO NOT EAT  AFTER MIDNIGHT :Monday  SPECIAL INSTRUCTIONS:clear liquids until 1030am on Tueday   YOU MAY BRUSH YOUR TEETH THE MORNING OF SURGERY   TAKE THESE MEDICINES THE DAY OF SURGERY WITH SIP OF WATER:none   DO NOT WEAR JEWELRY, EYE MAKEUP, LIPSTICK OR DARK FINGERNAIL POLISH DO NOT WEAR LOTIONS  DO NOT SHAVE FOR 48 HOURS PRIOR TO SURGERY  YOU WILL NOT BE ALLOWED TO DRIVE YOURSELF HOME.  NAME OF DRIVER: Gillis Santa

## 2011-09-11 ENCOUNTER — Encounter (HOSPITAL_COMMUNITY): Payer: Self-pay

## 2011-09-11 ENCOUNTER — Inpatient Hospital Stay (HOSPITAL_COMMUNITY): Payer: Medicaid Other

## 2011-09-11 ENCOUNTER — Encounter (HOSPITAL_COMMUNITY): Payer: Self-pay | Admitting: Anesthesiology

## 2011-09-11 ENCOUNTER — Encounter (HOSPITAL_COMMUNITY)
Admission: RE | Disposition: A | Discharge status: Home / Self Care | Payer: Self-pay | Source: Ambulatory Visit | Attending: Obstetrics and Gynecology

## 2011-09-11 ENCOUNTER — Inpatient Hospital Stay (HOSPITAL_COMMUNITY)
Admission: RE | Admit: 2011-09-11 | Discharge: 2011-09-13 | DRG: 766 | Disposition: A | Discharge status: Home / Self Care | Payer: Medicaid Other | Source: Ambulatory Visit | Attending: Obstetrics and Gynecology | Admitting: Obstetrics and Gynecology

## 2011-09-11 DIAGNOSIS — Z01818 Encounter for other preprocedural examination: Secondary | ICD-10-CM

## 2011-09-11 DIAGNOSIS — Z302 Encounter for sterilization: Secondary | ICD-10-CM

## 2011-09-11 DIAGNOSIS — Z01812 Encounter for preprocedural laboratory examination: Secondary | ICD-10-CM

## 2011-09-11 DIAGNOSIS — O34219 Maternal care for unspecified type scar from previous cesarean delivery: Principal | ICD-10-CM | POA: Diagnosis present

## 2011-09-11 SURGERY — Surgical Case
Anesthesia: Spinal | Site: Abdomen | Wound class: Clean Contaminated

## 2011-09-11 MED ORDER — SENNOSIDES-DOCUSATE SODIUM 8.6-50 MG PO TABS
2.0000 | ORAL_TABLET | Freq: Every day | ORAL | Status: DC
  Administered 2011-09-12: 2 via ORAL

## 2011-09-11 MED ORDER — NALBUPHINE HCL 10 MG/ML IJ SOLN
5.0000 mg | INTRAMUSCULAR | Status: DC | PRN
  Administered 2011-09-11 (×2): 5 mg via INTRAVENOUS
  Administered 2011-09-12: 10 mg via INTRAVENOUS
  Filled 2011-09-11 (×3): qty 1

## 2011-09-11 MED ORDER — PHENYLEPHRINE 40 MCG/ML (10ML) SYRINGE FOR IV PUSH (FOR BLOOD PRESSURE SUPPORT)
PREFILLED_SYRINGE | INTRAVENOUS | Status: AC
  Filled 2011-09-11: qty 5

## 2011-09-11 MED ORDER — SCOPOLAMINE 1 MG/3DAYS TD PT72
1.0000 | MEDICATED_PATCH | Freq: Once | TRANSDERMAL | Status: DC
  Administered 2011-09-11: 1.5 mg via TRANSDERMAL

## 2011-09-11 MED ORDER — ONDANSETRON HCL 4 MG/2ML IJ SOLN: 4.0000 mg | INTRAMUSCULAR | Status: DC | PRN

## 2011-09-11 MED ORDER — NALOXONE HCL 0.4 MG/ML IJ SOLN: 0.4000 mg | INTRAMUSCULAR | Status: DC | PRN

## 2011-09-11 MED ORDER — BUPIVACAINE IN DEXTROSE 0.75-8.25 % IT SOLN
INTRATHECAL | Status: DC | PRN
  Administered 2011-09-11: 1.4 mL via INTRATHECAL

## 2011-09-11 MED ORDER — ZOLPIDEM TARTRATE 5 MG PO TABS: 5.0000 mg | ORAL_TABLET | Freq: Every evening | ORAL | Status: DC | PRN

## 2011-09-11 MED ORDER — ONDANSETRON HCL 4 MG/2ML IJ SOLN
INTRAMUSCULAR | Status: AC
  Filled 2011-09-11: qty 2

## 2011-09-11 MED ORDER — OXYTOCIN 10 UNIT/ML IJ SOLN
20.0000 [IU] | INTRAVENOUS | Status: DC | PRN
  Administered 2011-09-11: 20 [IU] via INTRAVENOUS

## 2011-09-11 MED ORDER — PRENATAL MULTIVITAMIN CH
1.0000 | ORAL_TABLET | Freq: Every day | ORAL | Status: DC
  Administered 2011-09-12 – 2011-09-13 (×2): 1 via ORAL
  Filled 2011-09-11 (×2): qty 1

## 2011-09-11 MED ORDER — METOCLOPRAMIDE HCL 5 MG/ML IJ SOLN: 10.0000 mg | Freq: Once | INTRAMUSCULAR | Status: DC | PRN

## 2011-09-11 MED ORDER — FENTANYL CITRATE 0.05 MG/ML IJ SOLN: 25.0000 ug | INTRAMUSCULAR | Status: DC | PRN

## 2011-09-11 MED ORDER — NALBUPHINE SYRINGE 5 MG/0.5 ML
INJECTION | INTRAMUSCULAR | Status: AC
  Administered 2011-09-11: 10 mg via SUBCUTANEOUS
  Filled 2011-09-11: qty 1

## 2011-09-11 MED ORDER — SCOPOLAMINE 1 MG/3DAYS TD PT72
MEDICATED_PATCH | TRANSDERMAL | Status: AC
  Filled 2011-09-11: qty 1

## 2011-09-11 MED ORDER — ONDANSETRON HCL 4 MG/2ML IJ SOLN: 4.0000 mg | Freq: Three times a day (TID) | INTRAMUSCULAR | Status: DC | PRN

## 2011-09-11 MED ORDER — KETOROLAC TROMETHAMINE 30 MG/ML IJ SOLN: 30.0000 mg | Freq: Four times a day (QID) | INTRAMUSCULAR | Status: AC | PRN

## 2011-09-11 MED ORDER — LACTATED RINGERS IV SOLN
INTRAVENOUS | Status: DC
  Administered 2011-09-11: 125 mL/h via INTRAVENOUS
  Administered 2011-09-11 (×2): via INTRAVENOUS

## 2011-09-11 MED ORDER — DIPHENHYDRAMINE HCL 25 MG PO CAPS: 25.0000 mg | ORAL_CAPSULE | Freq: Four times a day (QID) | ORAL | Status: DC | PRN

## 2011-09-11 MED ORDER — EPHEDRINE SULFATE 50 MG/ML IJ SOLN
INTRAMUSCULAR | Status: DC | PRN
  Administered 2011-09-11 (×2): 10 mg via INTRAVENOUS

## 2011-09-11 MED ORDER — FENTANYL CITRATE 0.05 MG/ML IJ SOLN
INTRAMUSCULAR | Status: DC | PRN
  Administered 2011-09-11: 25 ug via INTRATHECAL
  Administered 2011-09-11 (×2): 25 ug via INTRAVENOUS

## 2011-09-11 MED ORDER — DIPHENHYDRAMINE HCL 25 MG PO CAPS: 25.0000 mg | ORAL_CAPSULE | ORAL | Status: DC | PRN

## 2011-09-11 MED ORDER — ONDANSETRON HCL 4 MG PO TABS: 4.0000 mg | ORAL_TABLET | ORAL | Status: DC | PRN

## 2011-09-11 MED ORDER — SODIUM CHLORIDE 0.9 % IV SOLN
1.0000 ug/kg/h | INTRAVENOUS | Status: DC | PRN
  Filled 2011-09-11: qty 2.5

## 2011-09-11 MED ORDER — METOCLOPRAMIDE HCL 5 MG/ML IJ SOLN: 10.0000 mg | Freq: Three times a day (TID) | INTRAMUSCULAR | Status: DC | PRN

## 2011-09-11 MED ORDER — DIPHENHYDRAMINE HCL 50 MG/ML IJ SOLN: 25.0000 mg | INTRAMUSCULAR | Status: DC | PRN

## 2011-09-11 MED ORDER — DIBUCAINE 1 % RE OINT: 1.0000 "application " | TOPICAL_OINTMENT | RECTAL | Status: DC | PRN

## 2011-09-11 MED ORDER — DIPHENHYDRAMINE HCL 50 MG/ML IJ SOLN: 12.5000 mg | INTRAMUSCULAR | Status: DC | PRN

## 2011-09-11 MED ORDER — OXYTOCIN 20 UNITS IN LACTATED RINGERS INFUSION - SIMPLE: 125.0000 mL/h | INTRAVENOUS | Status: AC

## 2011-09-11 MED ORDER — LACTATED RINGERS IV SOLN
INTRAVENOUS | Status: DC
Start: 2011-09-11 — End: 2011-09-13
  Administered 2011-09-12: 02:00:00 via INTRAVENOUS

## 2011-09-11 MED ORDER — EPHEDRINE 5 MG/ML INJ
INTRAVENOUS | Status: AC
  Filled 2011-09-11: qty 10

## 2011-09-11 MED ORDER — MEPERIDINE HCL 25 MG/ML IJ SOLN: 6.2500 mg | INTRAMUSCULAR | Status: DC | PRN

## 2011-09-11 MED ORDER — DIPHENHYDRAMINE HCL 50 MG/ML IJ SOLN
INTRAMUSCULAR | Status: DC | PRN
  Administered 2011-09-11: 25 mg via INTRAVENOUS

## 2011-09-11 MED ORDER — PHENYLEPHRINE HCL 10 MG/ML IJ SOLN
INTRAMUSCULAR | Status: DC | PRN
  Administered 2011-09-11 (×3): 80 ug via INTRAVENOUS
  Administered 2011-09-11: 40 ug via INTRAVENOUS

## 2011-09-11 MED ORDER — LANOLIN HYDROUS EX OINT: 1.0000 "application " | TOPICAL_OINTMENT | CUTANEOUS | Status: DC | PRN

## 2011-09-11 MED ORDER — CEFAZOLIN SODIUM-DEXTROSE 2-3 GM-% IV SOLR
2.0000 g | INTRAVENOUS | Status: AC
  Administered 2011-09-11: 2 g via INTRAVENOUS
  Filled 2011-09-11: qty 50

## 2011-09-11 MED ORDER — OXYTOCIN 20 UNITS IN LACTATED RINGERS INFUSION - SIMPLE
INTRAVENOUS | Status: AC
  Filled 2011-09-11: qty 1000

## 2011-09-11 MED ORDER — KETOROLAC TROMETHAMINE 30 MG/ML IJ SOLN
30.0000 mg | Freq: Four times a day (QID) | INTRAMUSCULAR | Status: AC | PRN
  Administered 2011-09-11: 30 mg via INTRAVENOUS

## 2011-09-11 MED ORDER — ONDANSETRON HCL 4 MG/2ML IJ SOLN
INTRAMUSCULAR | Status: DC | PRN
  Administered 2011-09-11 (×2): 2 mg via INTRAVENOUS

## 2011-09-11 MED ORDER — NALBUPHINE HCL 10 MG/ML IJ SOLN
5.0000 mg | INTRAMUSCULAR | Status: DC | PRN
  Administered 2011-09-11 – 2011-09-12 (×2): 10 mg via SUBCUTANEOUS
  Filled 2011-09-11 (×2): qty 1

## 2011-09-11 MED ORDER — MENTHOL 3 MG MT LOZG: 1.0000 | LOZENGE | OROMUCOSAL | Status: DC | PRN

## 2011-09-11 MED ORDER — MORPHINE SULFATE (PF) 0.5 MG/ML IJ SOLN
INTRAMUSCULAR | Status: DC | PRN
  Administered 2011-09-11: .15 mg via EPIDURAL

## 2011-09-11 MED ORDER — KETOROLAC TROMETHAMINE 30 MG/ML IJ SOLN
INTRAMUSCULAR | Status: AC
  Administered 2011-09-11: 30 mg via INTRAVENOUS
  Filled 2011-09-11: qty 1

## 2011-09-11 MED ORDER — SIMETHICONE 80 MG PO CHEW: 80.0000 mg | CHEWABLE_TABLET | ORAL | Status: DC | PRN

## 2011-09-11 MED ORDER — TETANUS-DIPHTH-ACELL PERTUSSIS 5-2.5-18.5 LF-MCG/0.5 IM SUSP
0.5000 mL | Freq: Once | INTRAMUSCULAR | Status: AC
  Administered 2011-09-12: 0.5 mL via INTRAMUSCULAR
  Filled 2011-09-11: qty 0.5

## 2011-09-11 MED ORDER — OXYTOCIN 10 UNIT/ML IJ SOLN
INTRAMUSCULAR | Status: AC
  Filled 2011-09-11: qty 2

## 2011-09-11 MED ORDER — DIPHENHYDRAMINE HCL 50 MG/ML IJ SOLN
INTRAMUSCULAR | Status: AC
Start: 2011-09-11 — End: 2011-09-11
  Filled 2011-09-11: qty 1

## 2011-09-11 MED ORDER — SIMETHICONE 80 MG PO CHEW
80.0000 mg | CHEWABLE_TABLET | Freq: Three times a day (TID) | ORAL | Status: DC
  Administered 2011-09-12 – 2011-09-13 (×4): 80 mg via ORAL

## 2011-09-11 MED ORDER — IBUPROFEN 600 MG PO TABS
600.0000 mg | ORAL_TABLET | Freq: Four times a day (QID) | ORAL | Status: DC | PRN
  Filled 2011-09-11 (×4): qty 1

## 2011-09-11 MED ORDER — WITCH HAZEL-GLYCERIN EX PADS: 1.0000 "application " | MEDICATED_PAD | CUTANEOUS | Status: DC | PRN

## 2011-09-11 MED ORDER — IBUPROFEN 600 MG PO TABS
600.0000 mg | ORAL_TABLET | Freq: Four times a day (QID) | ORAL | Status: DC
  Administered 2011-09-11 – 2011-09-13 (×6): 600 mg via ORAL
  Filled 2011-09-11 (×2): qty 1

## 2011-09-11 MED ORDER — SODIUM CHLORIDE 0.9 % IJ SOLN: 3.0000 mL | INTRAMUSCULAR | Status: DC | PRN

## 2011-09-11 MED ORDER — FENTANYL CITRATE 0.05 MG/ML IJ SOLN
INTRAMUSCULAR | Status: AC
  Filled 2011-09-11: qty 2

## 2011-09-11 MED ORDER — OXYCODONE-ACETAMINOPHEN 5-325 MG PO TABS
1.0000 | ORAL_TABLET | ORAL | Status: DC | PRN
  Administered 2011-09-12 – 2011-09-13 (×4): 1 via ORAL
  Filled 2011-09-11 (×4): qty 1

## 2011-09-11 MED ORDER — MORPHINE SULFATE 0.5 MG/ML IJ SOLN
INTRAMUSCULAR | Status: AC
  Filled 2011-09-11: qty 10

## 2011-09-11 SURGICAL SUPPLY — 22 items
APL SKNCLS STERI-STRIP NONHPOA (GAUZE/BANDAGES/DRESSINGS) ×1
BENZOIN TINCTURE PRP APPL 2/3 (GAUZE/BANDAGES/DRESSINGS) ×1 IMPLANT
CHLORAPREP W/TINT 26ML (MISCELLANEOUS) ×2 IMPLANT
CONTAINER PREFILL 10% NBF 15ML (MISCELLANEOUS) ×2 IMPLANT
DRESSING TELFA 8X3 (GAUZE/BANDAGES/DRESSINGS) ×1 IMPLANT
ELECT REM PT RETURN 9FT ADLT (ELECTROSURGICAL) ×2
ELECTRODE REM PT RTRN 9FT ADLT (ELECTROSURGICAL) ×1 IMPLANT
GAUZE SPONGE 4X4 12PLY STRL LF (GAUZE/BANDAGES/DRESSINGS) ×1 IMPLANT
GLOVE BIOGEL PI IND STRL 6.5 (GLOVE) ×2 IMPLANT
GLOVE BIOGEL PI INDICATOR 6.5 (GLOVE) ×2
GLOVE ECLIPSE 7.0 STRL STRAW (GLOVE) ×1 IMPLANT
GLOVE ECLIPSE 7.5 STRL STRAW (GLOVE) ×1 IMPLANT
GLOVE SURG SS PI 6.0 STRL IVOR (GLOVE) ×2 IMPLANT
NS IRRIG 1000ML POUR BTL (IV SOLUTION) ×2 IMPLANT
PACK C SECTION WH (CUSTOM PROCEDURE TRAY) ×2 IMPLANT
PAD ABD 7.5X8 STRL (GAUZE/BANDAGES/DRESSINGS) ×1 IMPLANT
RTRCTR C-SECT PINK 25CM LRG (MISCELLANEOUS) ×1 IMPLANT
STRIP CLOSURE SKIN 1/2X4 (GAUZE/BANDAGES/DRESSINGS) ×1 IMPLANT
SUT PLAIN 0 NONE (SUTURE) ×2 IMPLANT
SUT VIC AB 0 CT1 36 (SUTURE) ×8 IMPLANT
TAPE CLOTH SURG 4X10 WHT LF (GAUZE/BANDAGES/DRESSINGS) ×1 IMPLANT
TRAY FOLEY CATH 14FR (SET/KITS/TRAYS/PACK) ×2 IMPLANT

## 2011-09-11 NOTE — Anesthesia Postprocedure Evaluation (Signed)
  Anesthesia Post-op Note  Patient: Colleen Russell  Procedure(s) Performed: Procedure(s) (LRB): CESAREAN SECTION WITH BILATERAL TUBAL LIGATION (N/A)  Patient Location: PACU  Anesthesia Type: Spinal  Level of Consciousness: awake, alert  and oriented  Airway and Oxygen Therapy: Patient Spontanous Breathing  Post-op Pain: none  Post-op Assessment: Post-op Vital signs reviewed, Patient's Cardiovascular Status Stable, Respiratory Function Stable, Patent Airway, No signs of Nausea or vomiting, Pain level controlled and No headache  Post-op Vital Signs: Reviewed and stable  Complications: No apparent anesthesia complications

## 2011-09-11 NOTE — Op Note (Signed)
[facility] PROCEDURE DATE: 09/11/2011  PREOPERATIVE DIAGNOSIS: Intrauterine pregnancy at  [redacted]w[redacted]d weeks gestation; elective repeat  POSTOPERATIVE DIAGNOSIS: The same  PROCEDURE: Repeat Low Transverse Cesarean Section and bilateral tubal ligation.  SURGEON:  Dr. Catalina Antigua  ASSISTANT: Dr Candelaria Celeste  INDICATIONS: Colleen Russell GENERAL is a 31 y.o. [id number] at [redacted]w[redacted]d scheduled for cesarean section secondary to elective repeat.  The risks of cesarean section discussed with the patient included but were not limited to: bleeding which may require transfusion or reoperation; infection which may require antibiotics; injury to bowel, bladder, ureters or other surrounding organs; injury to the fetus; need for additional procedures including hysterectomy in the event of a life-threatening hemorrhage; placental abnormalities wth subsequent pregnancies, incisional problems, thromboembolic phenomenon and other postoperative/anesthesia complications. The patient concurred with the proposed plan, giving informed written consent for the procedure.    FINDINGS:  Viable female infant in cephalic presentation.  Apgars 8 and 9, weight pending.  Clear amniotic fluid.  Intact placenta, three vessel cord.  Normal uterus, fallopian tubes and ovaries bilaterally.  ANESTHESIA:    Spinal INTRAVENOUS FLUIDS: 2500 ml ESTIMATED BLOOD LOSS: 500 ml URINE OUTPUT:  100 ml SPECIMENS: Placenta sent to L&D COMPLICATIONS: None immediate  PROCEDURE IN DETAIL:  The patient received intravenous antibiotics and had sequential compression devices applied to her lower extremities while in the preoperative area.  She was then taken to the operating room where spinal anesthesia was administered and was found to be adequate. She was then placed in a dorsal supine position with a leftward tilt, and prepped and draped in a sterile manner.  A foley catheter was placed into her bladder and attached to constant gravity, which drained  clear fluid throughout.  After an adequate timeout was performed, a Pfannenstiel skin incision was made with scalpel and carried through to the underlying layer of fascia. The fascia was incised in the midline and this incision was extended bilaterally using the Mayo scissors. Kocher clamps were applied to the superior aspect of the fascial incision and the underlying rectus muscles were dissected off bluntly. A similar process was carried out on the inferior aspect of the facial incision. The rectus muscles were separated in the midline bluntly and the peritoneum was entered bluntly. An Alexis retractor was inserted.  Attention was turned to the lower uterine segment where a transverse hysterotomy was made with a scalpel and extended bilaterally bluntly. The infant was successfully delivered, and cord was clamped and cut and infant was handed over to awaiting neonatology team. Uterine massage was then administered and the placenta delivered intact with three-vessel cord. The uterus was then cleared of clot and debris.  The hysterotomy was closed with 0 Vicryl in a single running locked fashion.  Overall, excellent hemostasis was noted.  Attention was then turned to the fallopian tubes.  A Filshie clip was placed on both tubes, about 2 cm from the cornua, with care given to incorporate the underlying mesosalpinx on both sides, allowing for bilateral tubal sterilization.  The abdomen and the pelvis were irrigated and cleared of all clot and debris. Hemostasis was confirmed on all surfaces.  The fascia was then closed using 0 Vicryl in a running fashion.  The subcutaneous layer was reapproximated with plain gut and the skin was closed with 4-0 Vicryl. The patient tolerated the procedure well. Sponge, lap, instrument and needle counts were correct x 2. She was taken to the recovery room in stable condition.    Levie Heritage, DO  09/11/2011 2:46 PM

## 2011-09-11 NOTE — Anesthesia Procedure Notes (Signed)
Spinal  Patient location during procedure: OR Start time: 09/11/2011 1:58 PM Staffing Anesthesiologist: Gregory Barrick A. Preanesthetic Checklist Completed: patient identified, site marked, surgical consent, pre-op evaluation, timeout performed, IV checked, risks and benefits discussed and monitors and equipment checked Spinal Block Patient position: sitting Prep: site prepped and draped and DuraPrep Patient monitoring: heart rate, cardiac monitor, continuous pulse ox and blood pressure Approach: midline Location: L3-4 Injection technique: single-shot Needle Needle type: Sprotte  Needle gauge: 24 G Needle length: 9 cm Assessment Sensory level: T4 Additional Notes Pt. Tolerated procedure well. Difficult due to poor positioning of patient and accentuated Lumbar lordosis. Adequate sensory level. Attempt x5. Successful using 17Ga Touhy LOR with air to identify epidural space. 24 Ga Sprotte through epidural needle. Csf clear, free flow, no heme or paresthesias.

## 2011-09-11 NOTE — Transfer of Care (Signed)
Immediate Anesthesia Transfer of Care Note  Patient: Colleen Russell  Procedure(s) Performed: Procedure(s) (LRB): CESAREAN SECTION WITH BILATERAL TUBAL LIGATION (N/A)  Patient Location: PACU  Anesthesia Type: Spinal  Level of Consciousness: awake, alert  and oriented  Airway & Oxygen Therapy: Patient Spontanous Breathing  Post-op Assessment: Report given to PACU RN and Post -op Vital signs reviewed and stable  Post vital signs: stable  Complications: No apparent anesthesia complications

## 2011-09-11 NOTE — Anesthesia Preprocedure Evaluation (Signed)
Anesthesia Evaluation  Patient identified by MRN, date of birth, ID band Patient awake    Reviewed: Allergy & Precautions, H&P , NPO status , Patient's Chart, lab work & pertinent test results  Airway Mallampati: III TM Distance: >3 FB Neck ROM: Full    Dental No notable dental hx. (+) Teeth Intact   Pulmonary neg pulmonary ROS, shortness of breath and with exertion,  breath sounds clear to auscultation  Pulmonary exam normal       Cardiovascular negative cardio ROS  Rhythm:Regular Rate:Normal     Neuro/Psych Depression negative neurological ROS  negative psych ROS   GI/Hepatic negative GI ROS, Neg liver ROS,   Endo/Other  negative endocrine ROSDiabetes mellitus-, Gestational  Renal/GU negative Renal ROS  negative genitourinary   Musculoskeletal negative musculoskeletal ROS (+)   Abdominal (+) + obese,   Peds  Hematology negative hematology ROS (+)   Anesthesia Other Findings Pierced tongue  Reproductive/Obstetrics (+) Pregnancy                           Anesthesia Physical Anesthesia Plan  ASA: III  Anesthesia Plan: Spinal   Post-op Pain Management:    Induction:   Airway Management Planned: Natural Airway  Additional Equipment:   Intra-op Plan:   Post-operative Plan:   Informed Consent: I have reviewed the patients History and Physical, chart, labs and discussed the procedure including the risks, benefits and alternatives for the proposed anesthesia with the patient or authorized representative who has indicated his/her understanding and acceptance.   Dental advisory given  Plan Discussed with: CRNA, Anesthesiologist and Surgeon  Anesthesia Plan Comments:         Anesthesia Quick Evaluation

## 2011-09-11 NOTE — H&P (Signed)
Colleen Russell is a 31 y.o. female presenting for scheduled elective repeat cesarean section and bilateral tubal ligation. Patient [facility]. Patient currently without complaints. History OB History    Grav Para Term Preterm Abortions TAB SAB Ect Mult Living   5 2 2  0 2 1 1  0 0 2     Past Medical History  Diagnosis Date  . History of bladder infections   . Abnormal Pap smear   . Trichomonas   . Diabetes mellitus in pregnancy 2009  . Depression     no meds, seeing counselor  . Shortness of breath     pregnancy related   Past Surgical History  Procedure Date  . Cesarean section    Family History: family history includes Diabetes in her sister and Hypertension in her mother. Social History:  reports that she quit smoking about 7 months ago. She has never used smokeless tobacco. She reports that she does not drink alcohol or use illicit drugs.  ROS    Blood pressure 123/73, pulse 98, temperature 98.4 F (36.9 C), temperature source Oral, resp. rate 16, last menstrual period 01/17/2011, SpO2 100.00%. Exam Physical Exam   GENERAL: Well-developed, well-nourished female in no acute distress.  HEENT: Normocephalic, atraumatic. Sclerae anicteric.  NECK: Supple. Normal thyroid.  LUNGS: Clear to auscultation bilaterally.  HEART: Regular rate and rhythm. ABDOMEN: Soft, nontender, Gravid. PELVIC: Not performed. EXTREMITIES: No cyanosis, clubbing, or edema, 2+ distal pulses.  Prenatal labs: ABO, Rh: A/Positive/-- (12/03 0000) Antibody:  neg Rubella: Nonimmune (12/13 0000) RPR: NON REACTIVE (05/23 1105)  HBsAg: Negative (12/13 0000)  HIV: Non-reactive (12/13 0000)  GBS:   declined by patient  Assessment/Plan: 31 yo [id number] at 39 weeks based on [facility] 09/18/2011 presenting today for scheduled elective repeat cesarean section and bilateral tubal ligation. - Risks, benefits and alternatives explained including but not limited to risk of  bleeding, infection, damage to adjacent organs, permanence of sterilization and associated risk of ectopic pregnancy with tubal ligation. - Patient verbalized understanding and all questions were answered. Consent signed.  Toby Ayad 09/11/2011, 1:09 PM

## 2011-09-11 NOTE — Consult Note (Signed)
Neonatology Note:   Attendance at C-section:    I was asked to attend this repeat C/S at term. The mother is a G5P2A2 A pos, Rubella NI, GBS unknown with an uncomplicated pregnancy (history of GDM with previous pregnancy). ROM at delivery, fluid clear. Infant vigorous with good spontaneous cry and tone. Needed only minimal bulb suctioning. Ap 8/9. Lungs clear to ausc in DR. To CN to care of Pediatrician.   Deatra James, MD

## 2011-09-12 ENCOUNTER — Encounter (HOSPITAL_COMMUNITY): Payer: Self-pay | Admitting: *Deleted

## 2011-09-12 LAB — CBC
HCT: 29.7 % — ABNORMAL LOW (ref 36.0–46.0)
MCH: 31.5 pg (ref 26.0–34.0)
MCV: 92.5 fL (ref 78.0–100.0)
Platelets: 228 10*3/uL (ref 150–400)
RDW: 13.7 % (ref 11.5–15.5)
WBC: 7.6 10*3/uL (ref 4.0–10.5)

## 2011-09-12 NOTE — Anesthesia Postprocedure Evaluation (Signed)
  Anesthesia Post Note  Patient: [name]  Procedure(s) Performed: Procedure(s) (LRB): CESAREAN SECTION WITH BILATERAL TUBAL LIGATION (N/A)  Anesthesia type: Spinal  Patient location: Mother/Baby  Post pain: Pain level controlled  Post assessment: Post-op Vital signs reviewed  Last Vitals:  Filed Vitals:   09/12/11 1000  BP: 99/62  Pulse: 82  Temp: 36.7 C  Resp: 16    Post vital signs: Reviewed  Level of consciousness: awake  Complications: No apparent anesthesia complications

## 2011-09-12 NOTE — Addendum Note (Signed)
Addendum  created 09/12/11 1349 by Algis Greenhouse, CRNA   Modules edited:Notes Section

## 2011-09-12 NOTE — Progress Notes (Signed)
I have seen this patient and agree with the above student's note.  Pain well controlled with Ibuprofen.  Plan for D/C tomorrow.  LEFTWICH-KIRBY, Abdirahim Flavell Certified Nurse-Midwife

## 2011-09-12 NOTE — Progress Notes (Signed)
Ur chart review completed.  

## 2011-09-12 NOTE — Progress Notes (Signed)
Subjective: Postpartum Day 1: Cesarean Delivery Patient reports:Up at lib. Incisional pain the same when ambulating as lying down. Ibuprofen provides some relief. No other complaints at this time.   Objective: Vital signs in last 24 hours: Temp:  [97.7 F (36.5 C)-99.2 F (37.3 C)] 97.7 F (36.5 C) (05/29 0602) Pulse Rate:  [77-98] 80  (05/29 0602) Resp:  [16-20] 18  (05/29 0602) BP: (99-133)/(50-73) 99/63 mmHg (05/29 0602) SpO2:  [98 %-100 %] 100 % (05/29 0602) Weight:  [110.678 kg (244 lb)] 110.678 kg (244 lb) (05/28 1712)  Physical Exam:  General: alert and cooperative Lochia: appropriate Incision: bandage intact. DVT Evaluation: No evidence of DVT seen on physical exam. Negative Homan's sign. No cords or calf tenderness.   Basename 09/12/11 0520  HGB 10.1*  HCT 29.7*    Assessment/Plan: Status post Cesarean section. Doing well postoperatively.  Continue current care.  Colleen Russell 09/12/2011, 7:40 AM
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
## 2011-09-13 MED ORDER — PNEUMOCOCCAL VAC POLYVALENT 25 MCG/0.5ML IJ INJ: 0.5000 mL | INJECTION | INTRAMUSCULAR | Status: DC

## 2011-09-13 MED ORDER — OXYCODONE-ACETAMINOPHEN 5-325 MG PO TABS: 1.0000 | ORAL_TABLET | ORAL | Status: AC | PRN

## 2011-09-13 MED ORDER — MEASLES, MUMPS & RUBELLA VAC ~~LOC~~ INJ
0.5000 mL | INJECTION | Freq: Once | SUBCUTANEOUS | Status: AC
  Administered 2011-09-13: 0.5 mL via SUBCUTANEOUS
  Filled 2011-09-13: qty 0.5

## 2011-09-13 MED ORDER — IBUPROFEN 600 MG PO TABS: 600.0000 mg | ORAL_TABLET | Freq: Four times a day (QID) | ORAL | Status: AC | PRN

## 2011-09-13 NOTE — Discharge Summary (Signed)
Obstetric Discharge Summary Reason for Admission: cesarean section Prenatal Procedures: ultrasound Intrapartum Procedures: cesarean: low cervical, transverse, BTL Postpartum Procedures: Rubella Ig Complications-Operative and Postpartum: none Hemoglobin  Date Value Range Status  09/12/2011 10.1* 12.0-15.0 (g/dL) Final     HCT  Date Value Range Status  09/12/2011 29.7* 36.0-46.0 (%) Final    Physical Exam:  General: alert, cooperative and no distress Lochia: appropriate Uterine Fundus: firm Incision: healing well, no significant drainage, no dehiscence, no significant erythema, small amount of old blood on steristrips DVT Evaluation: Negative Homan's sign.  Discharge Diagnoses: Term Pregnancy-delivered  Discharge Information: Date: 09/13/2011 Activity: pelvic rest Diet: routine Medications: PNV, Ibuprofen, Colace and Percocet Condition: stable Instructions: refer to practice specific booklet Discharge to: home Follow-up Information    Follow up with [facility] [place] in 6 weeks. (or [facility] as needed)    Contact information:   [address] [employer] [place] [address]          Newborn Data: Live born female  Birth Weight: 8 lb 2.7 oz (3705 g) APGAR: 8, 9  Home with mother. Breast/bottle  Colleen Russell 09/13/2011, 7:52 AM

## 2011-09-20 NOTE — Op Note (Signed)
Agree with above note.  Colleen Russell 09/20/2011 1:17 PM   

## 2011-12-18 ENCOUNTER — Encounter (HOSPITAL_COMMUNITY): Payer: Self-pay | Admitting: *Deleted

## 2011-12-18 DIAGNOSIS — R1110 Vomiting, unspecified: Secondary | ICD-10-CM | POA: Insufficient documentation

## 2011-12-18 DIAGNOSIS — R300 Dysuria: Secondary | ICD-10-CM | POA: Insufficient documentation

## 2011-12-18 LAB — URINALYSIS, ROUTINE W REFLEX MICROSCOPIC
Bilirubin Urine: NEGATIVE
Ketones, ur: NEGATIVE mg/dL
Nitrite: NEGATIVE
Specific Gravity, Urine: 1.015 (ref 1.005–1.030)
Urobilinogen, UA: 1 mg/dL (ref 0.0–1.0)
pH: 7 (ref 5.0–8.0)

## 2011-12-18 LAB — URINE MICROSCOPIC-ADD ON

## 2011-12-18 NOTE — ED Notes (Signed)
Patient with c/o possible uti.  States that it hurts when she urinates and also has been vomiting for about two days.  Denies diarrhea

## 2011-12-19 ENCOUNTER — Encounter (HOSPITAL_COMMUNITY): Payer: Self-pay

## 2011-12-19 ENCOUNTER — Emergency Department (HOSPITAL_COMMUNITY)
Admission: EM | Admit: 2011-12-19 | Discharge: 2011-12-19 | Disposition: A | Discharge status: Home / Self Care | Payer: Medicaid Other | Attending: Emergency Medicine | Admitting: Emergency Medicine

## 2011-12-19 DIAGNOSIS — N390 Urinary tract infection, site not specified: Secondary | ICD-10-CM | POA: Insufficient documentation

## 2011-12-19 DIAGNOSIS — Z87891 Personal history of nicotine dependence: Secondary | ICD-10-CM | POA: Insufficient documentation

## 2011-12-19 MED ORDER — PHENAZOPYRIDINE HCL 100 MG PO TABS
200.0000 mg | ORAL_TABLET | Freq: Once | ORAL | Status: AC
  Administered 2011-12-19: 200 mg via ORAL
  Filled 2011-12-19: qty 2

## 2011-12-19 MED ORDER — CIPROFLOXACIN HCL 500 MG PO TABS: 500.0000 mg | ORAL_TABLET | Freq: Two times a day (BID) | ORAL | Status: AC

## 2011-12-19 MED ORDER — PHENAZOPYRIDINE HCL 200 MG PO TABS: 200.0000 mg | ORAL_TABLET | Freq: Three times a day (TID) | ORAL | Status: AC

## 2011-12-19 MED ORDER — CIPROFLOXACIN HCL 500 MG PO TABS
500.0000 mg | ORAL_TABLET | Freq: Once | ORAL | Status: AC
  Administered 2011-12-19: 500 mg via ORAL
  Filled 2011-12-19: qty 1

## 2011-12-19 NOTE — ED Notes (Signed)
UNABLE TO LOCATE PT. AT TRIAGE WAITING AREA BY EMT.

## 2011-12-19 NOTE — ED Notes (Signed)
Patient unable to provide urine sample.  Ed physician aware and it is okay to discharge patient

## 2011-12-19 NOTE — ED Notes (Signed)
Pt states she was here yesterday and gave a urine sample, but was not seen.

## 2011-12-19 NOTE — ED Notes (Signed)
Pt states she is having painful urination and back hurts, she reports blood in her urine.

## 2011-12-19 NOTE — ED Notes (Signed)
Unable to locate patient x2.

## 2011-12-21 NOTE — ED Provider Notes (Signed)
History     CSN: [id number]  Arrival date & time 12/19/11  0809   First MD Initiated Contact with Patient 12/19/11 1025      Chief Complaint  Patient presents with  . Dysuria    (Consider location/radiation/quality/duration/timing/severity/associated sxs/prior treatment) HPI Comments: Pt is a 31 year old woman who has noted painful urination, feels lightheaded, and has back pain.  She has a prior history of urinary tract infection.  Patient is a 31 y.o. female presenting with dysuria. The history is provided by the patient. No language interpreter was used.  Dysuria  This is a new problem. The current episode started yesterday. The problem occurs every urination. The problem has not changed since onset.The quality of the pain is described as burning. The pain is at a severity of 7/10. The pain is moderate. There has been no fever. Associated symptoms comments: Back pain, feels lightheaded.. She has tried nothing for the symptoms.    Past Medical History  Diagnosis Date  . History of bladder infections   . Abnormal Pap smear   . Trichomonas   . Diabetes mellitus in pregnancy 2009  . Depression     no meds, seeing counselor  . Shortness of breath     pregnancy related    Past Surgical History  Procedure Date  . Cesarean section     Family History  Problem Relation Age of Onset  . Hypertension Mother   . Diabetes Sister     History  Substance Use Topics  . Smoking status: Former Smoker    Quit date: 01/14/2011  . Smokeless tobacco: Never Used  . Alcohol Use: No    OB History    Grav Para Term Preterm Abortions TAB SAB Ect Mult Living   5 3 3  0 2 1 1  0 0 3      Review of Systems  Genitourinary: Positive for dysuria.  All other systems reviewed and are negative.    Allergies  Review of patient's allergies indicates no known allergies.  Home Medications   Current Outpatient Rx  Name Route Sig Dispense Refill  . CIPROFLOXACIN HCL 500 MG PO TABS Oral  Take 1 tablet (500 mg total) by mouth 2 (two) times daily. 14 tablet 0  . PHENAZOPYRIDINE HCL 200 MG PO TABS Oral Take 1 tablet (200 mg total) by mouth 3 (three) times daily. 6 tablet 0    BP 122/75  Pulse 96  Temp 99.3 F (37.4 C) (Oral)  Resp 18  SpO2 97%  Breastfeeding? Unknown  Physical Exam  Nursing note and vitals reviewed. Constitutional: She is oriented to person, place, and time. She appears well-developed and well-nourished. No distress.  HENT:  Head: Normocephalic and atraumatic.  Right Ear: External ear normal.  Left Ear: External ear normal.  Mouth/Throat: Oropharynx is clear and moist.  Eyes: Conjunctivae and EOM are normal. Pupils are equal, round, and reactive to light.  Neck: Normal range of motion. Neck supple.  Cardiovascular: Normal rate, regular rhythm and normal heart sounds.   Pulmonary/Chest: Effort normal and breath sounds normal.  Abdominal: Soft. There is no tenderness.  Musculoskeletal:       Back has no bony or CVA tenderness.  Neurological: She is alert and oriented to person, place, and time.       No sensory or motor deficit.  Skin: Skin is warm and dry.  Psychiatric: She has a normal mood and affect. Her behavior is normal.    ED Course  Procedures (including  critical care time)  Results for orders placed during the hospital encounter of 12/19/11  URINALYSIS, ROUTINE W REFLEX MICROSCOPIC      Component Value Range   Color, Urine AMBER (*) YELLOW   APPearance TURBID (*) CLEAR   Specific Gravity, Urine 1.015  1.005 - 1.030   pH 7.0  5.0 - 8.0   Glucose, UA NEGATIVE  NEGATIVE mg/dL   Hgb urine dipstick MODERATE (*) NEGATIVE   Bilirubin Urine NEGATIVE  NEGATIVE   Ketones, ur NEGATIVE  NEGATIVE mg/dL   Protein, ur 409 (*) NEGATIVE mg/dL   Urobilinogen, UA 1.0  0.0 - 1.0 mg/dL   Nitrite NEGATIVE  NEGATIVE   Leukocytes, UA LARGE (*) NEGATIVE  POCT PREGNANCY, URINE      Component Value Range   Preg Test, Ur NEGATIVE  NEGATIVE  URINE  MICROSCOPIC-ADD ON      Component Value Range   Squamous Epithelial / LPF MANY (*) RARE   WBC, UA TOO NUMEROUS TO COUNT  <3 WBC/hpf   RBC / HPF 3-6  <3 RBC/hpf   Bacteria, UA MANY (*) RARE   UA shows UTI.  Rx Cipro, Pyridium.    1. Urinary tract infection       Carleene Cooper III, MD 12/21/11 [phone]

## 2012-01-19 IMAGING — CT CT ABD-PELV W/ CM
2 of 4 series · 17 of 46 positions shown, 19 images · IV contrast (CONTRAST)
Comparison: Pelvic ultrasound 02/03/2010

CLINICAL DATA: Lower abdominal pain.  Nausea, vomiting.

CT ABDOMEN AND PELVIS WITH CONTRAST
TECHNIQUE: Multidetector CT imaging of the abdomen and pelvis was
performed following the standard protocol during bolus
administration of intravenous contrast.
Contrast: 100 ml Omnipaque 300 IV.

[Series 2: routine · axial · 0.74mm/px · z∈[-457,-47]mm · 14 of 90 slices shown, 16 images]
[im 4/90  soft-tissue]
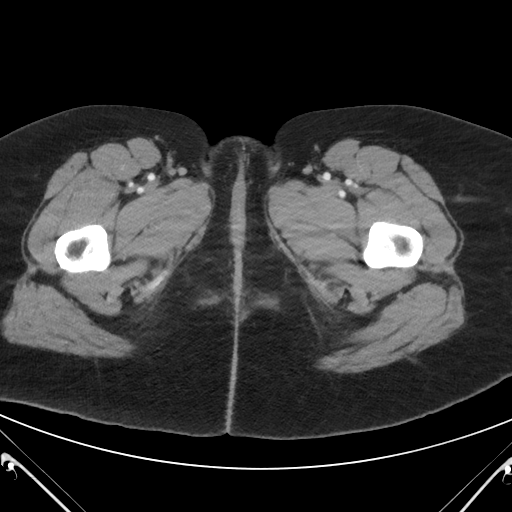
[im 4/90  bone]
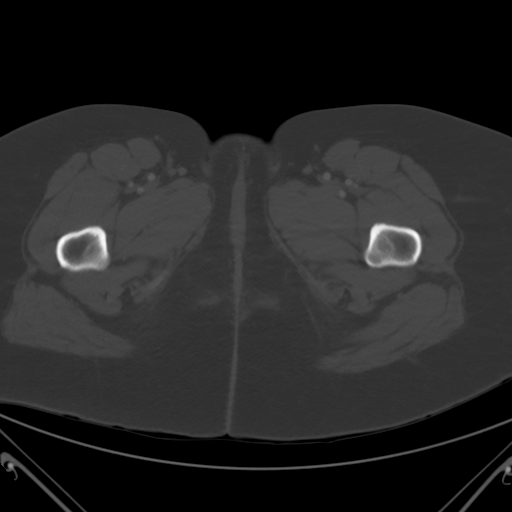
[im 11/90  soft-tissue]
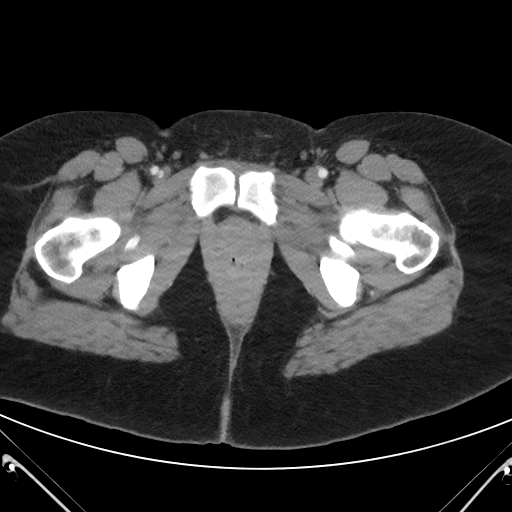
[im 18/90  soft-tissue]
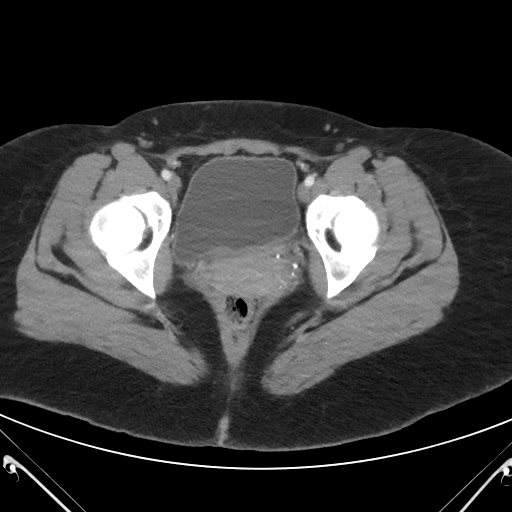
[im 25/90  soft-tissue]
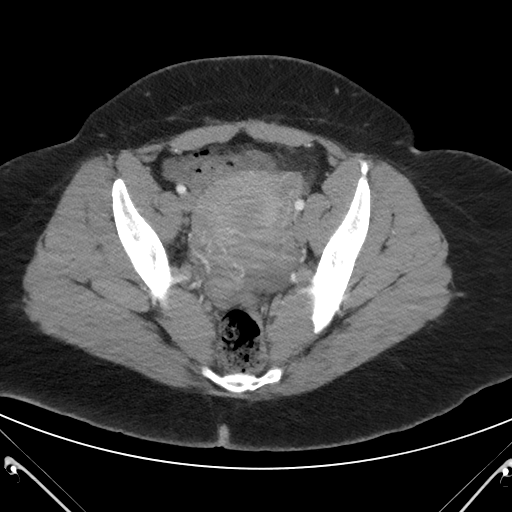
[im 29/90  soft-tissue]
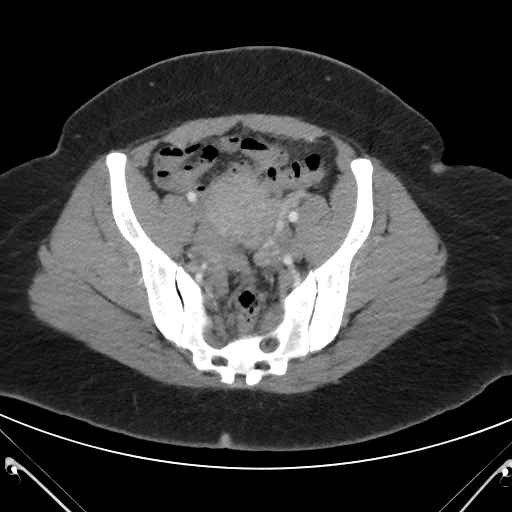
[im 36/90  soft-tissue]
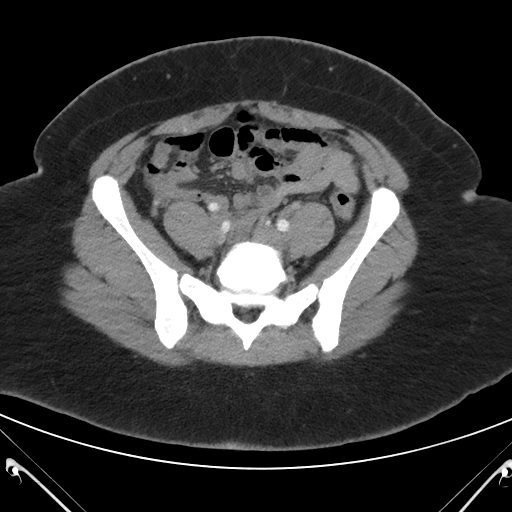
[im 43/90  soft-tissue]
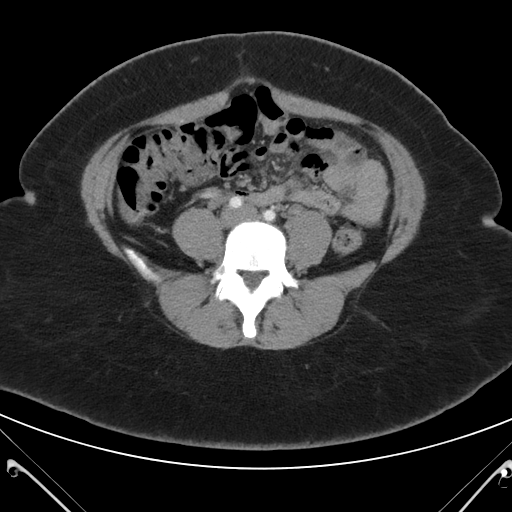
[im 47/90  soft-tissue]
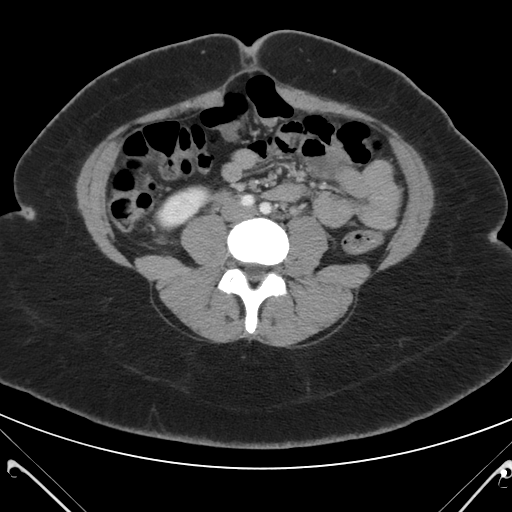
[im 54/90  soft-tissue]
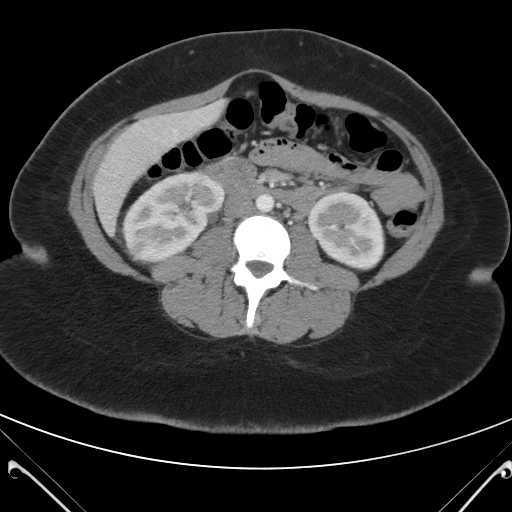
[im 54/90  bone]
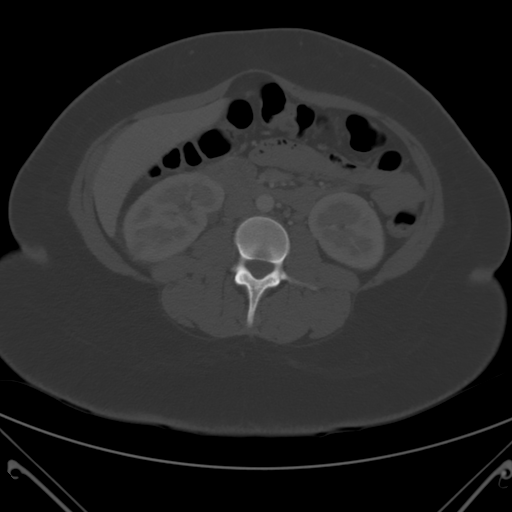
[im 61/90  soft-tissue]
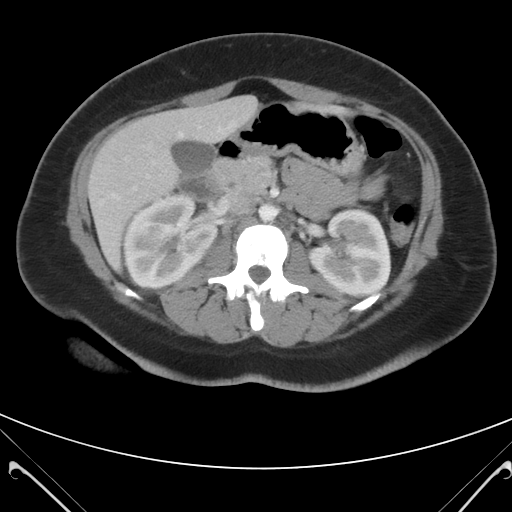
[im 68/90  soft-tissue]
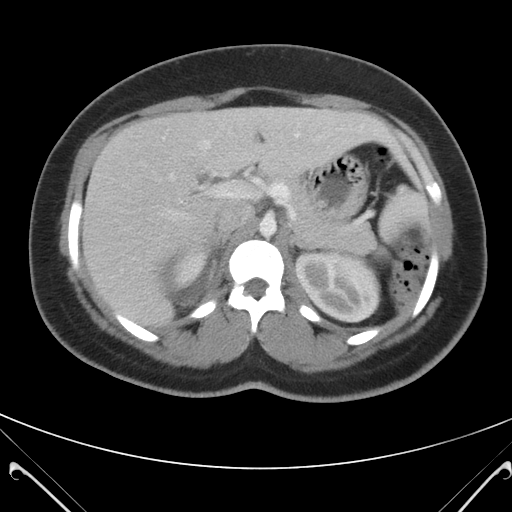
[im 72/90  soft-tissue]
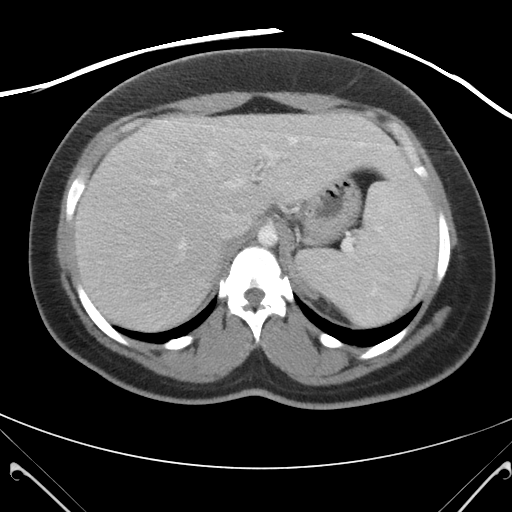
[im 79/90  soft-tissue]
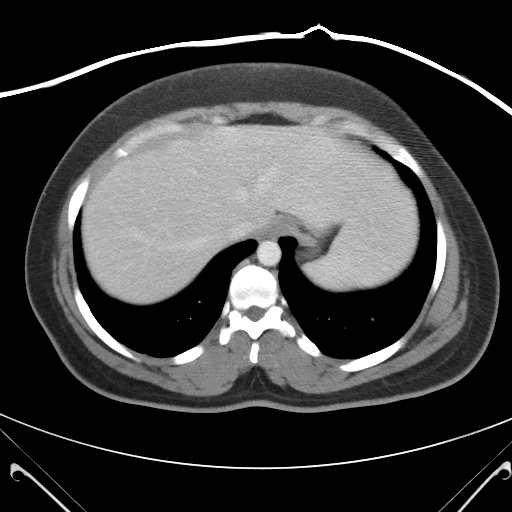
[im 86/90  soft-tissue]
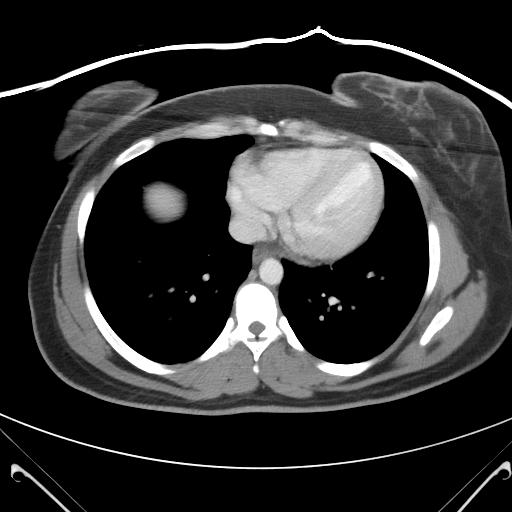

[(person_name) thins · coronal · 0.87mm/px · 3 of 127 slices shown]
[im 43/127  soft-tissue]
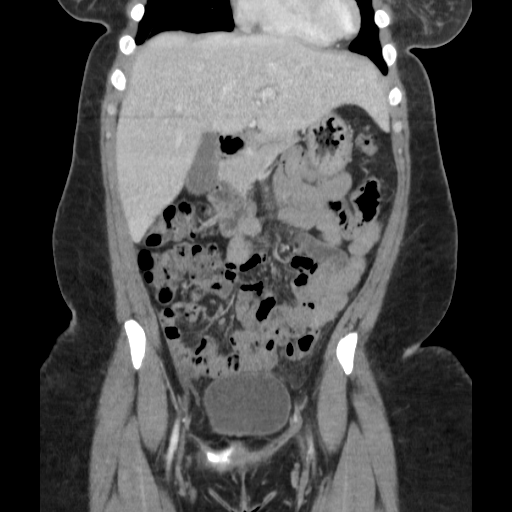
[im 57/127  soft-tissue]
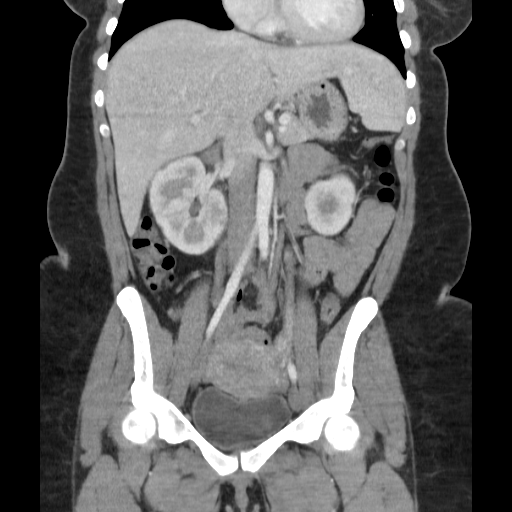
[im 71/127  soft-tissue]
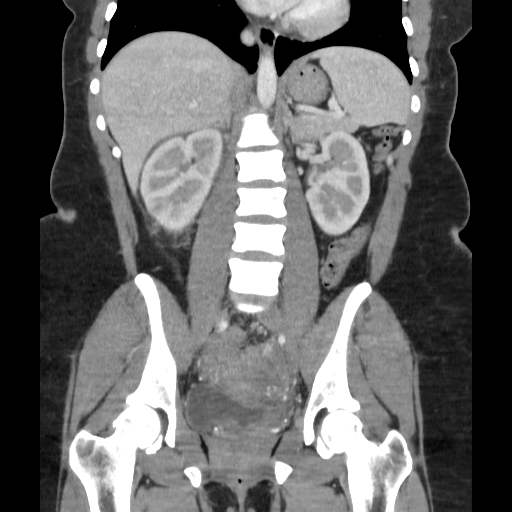

[17 of 46 positions shown; findings below may reference images not displayed]

FINDINGS: Lung bases are clear.  No effusions.  Heart is normal
size.

Liver, gallbladder, spleen, pancreas, adrenals and left kidney are
unremarkable.  There are areas of abnormal enhancement within the
right kidney.  I cannot completely exclude pyelonephritis.
Recommend clinical correlation.  No hydronephrosis.  Aorta is
normal caliber.

Appendix is visualized and is normal.  Uterus and adnexa grossly
unremarkable and better visualized on earlier ultrasound.  Trace
free fluid in the pelvis.  Large and small bowel grossly
unremarkable.

No acute bony abnormality.
IMPRESSION: Areas of abnormal enhancement within the right kidney.  Cannot
exclude pyelonephritis.  Recommend clinical correlation.

## 2013-08-09 ENCOUNTER — Emergency Department (HOSPITAL_COMMUNITY)
Admission: EM | Admit: 2013-08-09 | Discharge: 2013-08-09 | Disposition: A | Discharge status: Home / Self Care | Payer: Medicaid Other | Attending: Emergency Medicine | Admitting: Emergency Medicine

## 2013-08-09 ENCOUNTER — Encounter (HOSPITAL_COMMUNITY): Payer: Self-pay | Admitting: Emergency Medicine

## 2013-08-09 DIAGNOSIS — Z8659 Personal history of other mental and behavioral disorders: Secondary | ICD-10-CM | POA: Insufficient documentation

## 2013-08-09 DIAGNOSIS — Z8632 Personal history of gestational diabetes: Secondary | ICD-10-CM | POA: Insufficient documentation

## 2013-08-09 DIAGNOSIS — G5600 Carpal tunnel syndrome, unspecified upper limb: Secondary | ICD-10-CM | POA: Insufficient documentation

## 2013-08-09 DIAGNOSIS — G5602 Carpal tunnel syndrome, left upper limb: Secondary | ICD-10-CM

## 2013-08-09 DIAGNOSIS — Z87891 Personal history of nicotine dependence: Secondary | ICD-10-CM | POA: Insufficient documentation

## 2013-08-09 DIAGNOSIS — Z8619 Personal history of other infectious and parasitic diseases: Secondary | ICD-10-CM | POA: Insufficient documentation

## 2013-08-09 DIAGNOSIS — Z87448 Personal history of other diseases of urinary system: Secondary | ICD-10-CM | POA: Insufficient documentation

## 2013-08-09 MED ORDER — NAPROXEN 250 MG PO TABS
500.0000 mg | ORAL_TABLET | Freq: Once | ORAL | Status: AC
  Administered 2013-08-09: 500 mg via ORAL
  Filled 2013-08-09: qty 2

## 2013-08-09 MED ORDER — PREDNISONE 20 MG PO TABS: 40.0000 mg | ORAL_TABLET | Freq: Every day | ORAL | Status: DC

## 2013-08-09 MED ORDER — PREDNISONE 20 MG PO TABS
40.0000 mg | ORAL_TABLET | Freq: Once | ORAL | Status: AC
  Administered 2013-08-09: 40 mg via ORAL
  Filled 2013-08-09: qty 2

## 2013-08-09 MED ORDER — NAPROXEN 500 MG PO TABS: 500.0000 mg | ORAL_TABLET | Freq: Two times a day (BID) | ORAL | Status: DC

## 2013-08-09 NOTE — ED Notes (Signed)
PA applied wrist splint to pt.

## 2013-08-09 NOTE — ED Provider Notes (Signed)
CSN: [id number]     Arrival date & time 08/09/13  [id number] History  This chart was scribed for non-physician practitioner, Coral CeoJessica Khristian Seals, PA-C working with Geoffery Lyonsouglas Delo, MD by Greggory StallionKayla Andersen, ED scribe. This patient was seen in room TR10C/TR10C and the patient's care was started at 11:22 AM.    Chief Complaint  Patient presents with  . Arm Pain  . Hand Pain   The history is provided by the patient. No language interpreter was used.   HPI Comments: Colleen Russell is a 33 y.o. female who presents to the Emergency Department complaining of gradual onset, burning, sharp, left wrist pain worsened 2 months ago. Pain radiates up her arm. Pt states she was diagnosed with carpal tunnel 2 years ago. Denies injury or trauma but lifts heavy boxes at work. She is also having numbness in her left fingers. No weakness or loss of sensation. Certain movements worsen the pain. Squeezing the area relieves some pain. Pt has taken a muscle relaxer and ibuprofen with no relief. She has also used a wrist brace with little relief. Denies fever, difficulty breathing, SOB, chest pain. LNMP was at the beginning of this month. States there is no chance she could be pregnant.    Past Medical History  Diagnosis Date  . History of bladder infections   . Abnormal Pap smear   . Trichomonas   . Diabetes mellitus in pregnancy 2009  . Depression     no meds, seeing counselor  . Shortness of breath     pregnancy related   Past Surgical History  Procedure Laterality Date  . Cesarean section     Family History  Problem Relation Age of Onset  . Hypertension Mother   . Diabetes Sister    History  Substance Use Topics  . Smoking status: Former Smoker    Quit date: 01/14/2011  . Smokeless tobacco: Never Used  . Alcohol Use: No   OB History   Grav Para Term Preterm Abortions TAB SAB Ect Mult Living   5 3 3  0 2 1 1  0 0 3     Review of Systems  Constitutional: Negative for fever.  Respiratory: Negative for  shortness of breath.   Cardiovascular: Negative for chest pain.  Gastrointestinal: Negative for nausea and vomiting.  Musculoskeletal: Positive for arthralgias (left wrist).  Skin: Negative for color change and wound.  Neurological: Positive for numbness. Negative for weakness.  All other systems reviewed and are negative.  Allergies  Review of patient's allergies indicates no known allergies.  Home Medications   Prior to Admission medications   Not on File   BP 123/75  Pulse 74  Temp(Src) 98.5 F (36.9 C) (Oral)  Resp 18  Ht 5\' 3"  (1.6 m)  Wt 189 lb (85.73 kg)  BMI 33.49 kg/m2  SpO2 98%  LMP 07/20/2013  Breastfeeding? No  Filed Vitals:   08/09/13 0947 08/09/13 0954 08/09/13 1147  BP: 123/75  121/68  Pulse: 74  68  Temp: 98.5 F (36.9 C)    TempSrc: Oral    Resp: 18  16  Height:  5\' 3"  (1.6 m)   Weight:  189 lb (85.73 kg)   SpO2: 98%  99%     Physical Exam  Nursing note and vitals reviewed. Constitutional: She is oriented to person, place, and time. She appears well-developed and well-nourished. No distress.  HENT:  Head: Normocephalic and atraumatic.  Eyes: EOM are normal.  Neck: Neck supple. No tracheal deviation present.  Cardiovascular:  Normal rate.   Radial pulses present and equal bilaterally  Pulmonary/Chest: Effort normal. No respiratory distress.  Musculoskeletal: Normal range of motion.       Hands: Tenderness to palpation to the left palmar wrist. No snuffbox tenderness. Positive Tinel's and Phalen's sign. Strength 5/5 in the upper extremities bilaterally. ROM intact in the left wrist. No digit or forearm tenderness.     Neurological: She is alert and oriented to person, place, and time.  Sensation intact in the UE   Skin: Skin is warm and dry. She is not diaphoretic.  No edema, erythema, ecchymosis or lacerations to the left UE throughout  Psychiatric: She has a normal mood and affect. Her behavior is normal.    ED Course  Procedures  (including critical care time)  DIAGNOSTIC STUDIES: Oxygen Saturation is 98% on RA, normal by my interpretation.    COORDINATION OF CARE: 11:27 AM-Discussed treatment plan which includes naprosyn and continued use of wrist brace with pt at bedside and pt agreed to plan. Will give pt orthopedic referral and advised her to follow up.  Labs Review Labs Reviewed - No data to display  Imaging Review No results found.   EKG Interpretation None      MDM   Colleen Spanbreah M Mehringer is a 33 y.o. female who presents to the Emergency Department complaining of gradual onset, burning, sharp, left wrist pain worsened 2 months ago. Pain likely due to carpal tunnel syndrome. Patient neurovascularly intact. No evidence of a septic joint. Patient afebrile and non-toxic in appearance.  No hx of injury. Patient given referral to orthopedics for further evaluation and management. Will try NSAID and steroid therapy with wrist splint to help with symptoms. RICE method discussed. Return precautions, discharge instructions, and follow-up was discussed with the patient before discharge.     Discharge Medication List as of 08/09/2013 11:41 AM    START taking these medications   Details  naproxen (NAPROSYN) 500 MG tablet Take 1 tablet (500 mg total) by mouth 2 (two) times daily with a meal., Starting 08/09/2013, Until Discontinued, Print    predniSONE (DELTASONE) 20 MG tablet Take 2 tablets (40 mg total) by mouth daily. Take 40 mg by mouth daily for 3 days, then 20mg  by mouth daily for 3 days, then 10mg  daily for 3 days, Starting 08/09/2013, Until Discontinued, Print        Final impressions: 1. Carpal tunnel syndrome of left wrist      Thomasenia SalesJessica Katlin Zayne Marovich PA-C    I personally performed the services described in this documentation, which was scribed in my presence. The recorded information has been reviewed and is accurate.  Jillyn LedgerJessica K Lynnix Schoneman, PA-C 08/09/13 2325

## 2013-08-09 NOTE — ED Notes (Addendum)
Pt c/o carpal tunnel to left arm and hand ongoing for 2 years, increased past 2 months. Pt works at [employer] and lifts lots of boxes. Pt was diagnosed with carpal tunnel when she was seen at urgent care. Pt has tried muscle relaxer and ibuprofen without relief.

## 2013-08-09 NOTE — Discharge Instructions (Signed)
Take prednisone for full dose - this may help decrease inflammation  Take naprosyn for pain - take with food  Wear wrist splint!  See RICE method below!  Return to the emergency department if you develop any changing/worsening condition, weakness, loss of sensation, spreading redness/swelling, fever or any other concerns (please read additional information regarding your condition below)   Carpal Tunnel Syndrome The carpal tunnel is a narrow area located on the palm side of your wrist. The tunnel is formed by the wrist bones and ligaments. Nerves, blood vessels, and tendons pass through the carpal tunnel. Repeated wrist motion or certain diseases may cause swelling within the tunnel. This swelling pinches the main nerve in the wrist (median nerve) and causes the painful hand and arm condition called carpal tunnel syndrome. CAUSES   Repeated wrist motions.  Wrist injuries.  Certain diseases like arthritis, diabetes, alcoholism, hyperthyroidism, and kidney failure.  Obesity.  Pregnancy. SYMPTOMS   A "pins and needles" feeling in your fingers or hand.  Tingling or numbness in your fingers or hand.  An aching feeling in your entire arm.  Wrist pain that goes up your arm to your shoulder.  Pain that goes down into your palm or fingers.  A weak feeling in your hands. DIAGNOSIS  Your caregiver will take your history and perform a physical exam. An electromyography test may be needed. This test measures electrical signals sent out by the muscles. The electrical signals are usually slowed by carpal tunnel syndrome. You may also need X-rays. TREATMENT  Carpal tunnel syndrome may clear up by itself. Your caregiver may recommend a wrist splint or medicine such as a nonsteroidal anti-inflammatory medicine. Cortisone injections may help. Sometimes, surgery may be needed to free the pinched nerve.  HOME CARE INSTRUCTIONS   Take all medicine as directed by your caregiver. Only take  over-the-counter or prescription medicines for pain, discomfort, or fever as directed by your caregiver.  If you were given a splint to keep your wrist from bending, wear it as directed. It is important to wear the splint at night. Wear the splint for as long as you have pain or numbness in your hand, arm, or wrist. This may take 1 to 2 months.  Rest your wrist from any activity that may be causing your pain. If your symptoms are work-related, you may need to talk to your employer about changing to a job that does not require using your wrist.  Put ice on your wrist after long periods of wrist activity.  Put ice in a plastic bag.  Place a towel between your skin and the bag.  Leave the ice on for 15-20 minutes, 03-04 times a day.  Keep all follow-up visits as directed by your caregiver. This includes any orthopedic referrals, physical therapy, and rehabilitation. Any delay in getting necessary care could result in a delay or failure of your condition to heal. SEEK IMMEDIATE MEDICAL CARE IF:   You have new, unexplained symptoms.  Your symptoms get worse and are not helped or controlled with medicines. MAKE SURE YOU:   Understand these instructions.  Will watch your condition.  Will get help right away if you are not doing well or get worse. Document Released: 03/30/2000 Document Revised: 06/25/2011 Document Reviewed: 02/16/2011 [facility] Patient Information 2014 [place], [place].  Wrist Pain Wrist injuries are frequent in adults and children. A sprain is an injury to the ligaments that hold your bones together. A strain is an injury to muscle or muscle cord-like  structures (tendons) from stretching or pulling. Generally, when wrists are moderately tender to touch following a fall or injury, a break in the bone (fracture) may be present. Most wrist sprains or strains are better in 3 to 5 days, but complete healing may take several weeks. HOME CARE INSTRUCTIONS   Put ice on the injured  area.  Put ice in a plastic bag.  Place a towel between your skin and the bag.  Leave the ice on for 15-20 minutes, 03-04 times a day, for the first 2 days.  Keep your arm raised above the level of your heart whenever possible to reduce swelling and pain.  Rest the injured area for at least 48 hours or as directed by your caregiver.  If a splint or elastic bandage has been applied, use it for as long as directed by your caregiver or until seen by a caregiver for a follow-up exam.  Only take over-the-counter or prescription medicines for pain, discomfort, or fever as directed by your caregiver.  Keep all follow-up appointments. You may need to follow up with a specialist or have follow-up X-rays. Improvement in pain level is not a guarantee that you did not fracture a bone in your wrist. The only way to determine whether or not you have a broken bone is by X-ray. SEEK IMMEDIATE MEDICAL CARE IF:   Your fingers are swollen, very red, white, or cold and blue.  Your fingers are numb or tingling.  You have increasing pain.  You have difficulty moving your fingers. MAKE SURE YOU:   Understand these instructions.  Will watch your condition.  Will get help right away if you are not doing well or get worse. Document Released: 01/10/2005 Document Revised: 06/25/2011 Document Reviewed: 05/24/2010 [facility] Patient Information 2014 [place], [place].  RICE: Routine Care for Injuries The routine care of many injuries includes Rest, Ice, Compression, and Elevation (RICE). HOME CARE INSTRUCTIONS  Rest is needed to allow your body to heal. Routine activities can usually be resumed when comfortable. Injured tendons and bones can take up to 6 weeks to heal. Tendons are the cord-like structures that attach muscle to bone.  Ice following an injury helps keep the swelling down and reduces pain.  Put ice in a plastic bag.  Place a towel between your skin and the bag.  Leave the ice on for 15-20  minutes, 03-04 times a day. Do this while awake, for the first 24 to 48 hours. After that, continue as directed by your caregiver.  Compression helps keep swelling down. It also gives support and helps with discomfort. If an elastic bandage has been applied, it should be removed and reapplied every 3 to 4 hours. It should not be applied tightly, but firmly enough to keep swelling down. Watch fingers or toes for swelling, bluish discoloration, coldness, numbness, or excessive pain. If any of these problems occur, remove the bandage and reapply loosely. Contact your caregiver if these problems continue.  Elevation helps reduce swelling and decreases pain. With extremities, such as the arms, hands, legs, and feet, the injured area should be placed near or above the level of the heart, if possible. SEEK IMMEDIATE MEDICAL CARE IF:  You have persistent pain and swelling.  You develop redness, numbness, or unexpected weakness.  Your symptoms are getting worse rather than improving after several days. These symptoms may indicate that further evaluation or further X-rays are needed. Sometimes, X-rays may not show a small broken bone (fracture) until 1 week or 10  days later. Make a follow-up appointment with your caregiver. Ask when your X-ray results will be ready. Make sure you get your X-ray results. Document Released: 07/15/2000 Document Revised: 06/25/2011 Document Reviewed: 09/01/2010 [facility] Patient Information 2014 [place], [place].   Emergency Department Resource Guide 1) Find a Doctor and Pay Out of Pocket Although you won't have to find out who is covered by your insurance plan, it is a good idea to ask around and get recommendations. You will then need to call the office and see if the doctor you have chosen will accept you as a new patient and what types of options they offer for patients who are self-pay. Some doctors offer discounts or will set up payment plans for their patients who do not  have insurance, but you will need to ask so you aren't surprised when you get to your appointment.  2) Contact Your Local Health Department Not all health departments have doctors that can see patients for sick visits, but many do, so it is worth a call to see if yours does. If you don't know where your local health department is, you can check in your phone book. The CDC also has a tool to help you locate your state's health department, and many state websites also have listings of all of their local health departments.  3) Find a Walk-in Clinic If your illness is not likely to be very severe or complicated, you may want to try a walk in clinic. These are popping up all over the country in pharmacies, drugstores, and shopping centers. They're usually staffed by nurse practitioners or physician assistants that have been trained to treat common illnesses and complaints. They're usually fairly quick and inexpensive. However, if you have serious medical issues or chronic medical problems, these are probably not your best option.  No Primary Care Doctor: - Call Health Connect at  [phone] - they can help you locate a primary care doctor that  accepts your insurance, provides certain services, etc. - Physician Referral Service- [phone]  Chronic Pain Problems: Organization         Address  Phone   Notes  [facility]  [phone] Patients need to be referred by their primary care doctor.   Medication Assistance: Organization         Address  Phone   Notes  [facility] [address] [place], [place] [address] [phone] --Must be a resident of [facility] [place] -- Must have NO insurance coverage whatsoever (no Medicaid/ Medicare, etc.) -- The pt. MUST have a primary care doctor that directs their care regularly and follows them in the community   MedAssist  [phone]   [employer]  [phone]    Agencies that  provide inexpensive medical care: Organization         Address  Phone   Notes  [facility]  [phone]   [facility]    [phone]) [phone]   [facility] [address] [place], [place] [address] [phone]   [facility] [address], [place] [phone]   [facility]anned Parenthood    [phone]   [facility]    [phone]) [phone]   [facility]  [address], [place] Phone:  [phone], Fax:  [phone] Hours of Operation:  9 am - 6 pm, M-F.  Also accepts Medicaid/Medicare and  self-pay.  [facility]  [address], [place] Phone: [phone], Fax: [phone]. Hours of Operation:  8:30 am - 5:30 pm, M-F.  Also accepts Medicaid and self-pay.  [facility] [place] [address] [place] Phone: [phone]   [facility] [address] Natasha Bence [place], [place] [phone], Ext. [phone] Mondays & Thursdays: 7-9 AM.  First 15 patients are seen on a first come, first serve basis.    Medicaid-accepting [facility] Providers:  Organization         Address  Phone   Notes  [facility] [address], [place] [phone] Also accepts self-pay patients.  [facility] [address] Laurell Josephs [place], [place]  [phone]   [facility] [address], [place] [phone]   [facility] [address] [place] [phone]   Renaye Rakers [address], [place]   [phone] Only accepts [place] Access [place] patients after they have their name applied to their card.   Self-Pay (no insurance) in [facility]:  Organization         Address  Phone   Notes  Sickle Cell Patients, [facility] [address] [place], [place] [phone]   [facility]  Urgent Care [address] [place], [place] [phone]   [facility]  [address], [place] [phone]   [facility]/Dr. Osei-Bonsu  [address], [place] or [address], [place] [phone] Phone number for both [place] and [place] locations is the same.  [facility] [address], [place] [phone]   [facility] [address], [place] or [address] [phone]   [facility] [address] [place] [phone], phone; [phone], fax Sees patients 1st and 3rd Saturday of every month.  Must not qualify for public or private insurance (i.e. Medicaid, Medicare, [place] Health Choice, Veterans' Benefits)  Household income should be no more than 200% of the poverty level The clinic cannot treat you if you are pregnant or think you are pregnant  Sexually transmitted diseases are not treated at the clinic.    Dental Care: Organization         Address  Phone  Notes  [facility] [place] [facility] [address] [place], [place] [phone] Accepts children up to age 47 who are enrolled in [place] or [place] Health Choice; pregnant women with a Medicaid card; and children who have applied for Medicaid or [place] Health Choice, but were declined, whose parents can pay a reduced fee at time of service.  [facility]  [address], [place] [phone] Accepts children up to age 87 who are enrolled in [place] or [place] Health Choice; pregnant women with a Medicaid card; and children who have applied for Medicaid or  Health Choice, but were declined, whose parents can pay a reduced fee at time of service.  [facility]  [address] [place], [place] [phone] Patients are seen by appointment only. Walk-ins  are not accepted. [facility] will see patients 66 years of age and older. Monday - Tuesday (8am-5pm) Most Wednesdays (8:30-5pm) $  30 per visit, cash only  [facility] Adult [employer] PROGRAM  [address], [facility] [phone] Patients are seen by appointment only. Walk-ins are not accepted. [facility] will see patients 5 years of age and older. One Wednesday Evening (Monthly: Volunteer Based).  $30 per visit, cash only  [employer]  [phone] for adults; Children under age 33, call Graduate Pediatric Dentistry at [phone]. Children aged 34-14, please call [phone] to request a pediatric application.  Dental services are provided in all areas of dental care including fillings, crowns and bridges, complete and partial dentures, implants, gum treatment, root canals, and extractions. Preventive care is also provided. Treatment is provided to both adults and children. Patients are selected via a lottery and there is often a waiting list.   [facility] [address] [place]  [phone] [email or web address]   [facility] [address] [place], [place] [phone], Ext. [phone] Second and Fourth Thursday of each month, opens at 6:30 AM; Clinic ends at 9 AM.  Patients are seen on a first-come first-served basis, and a limited number are seen during each clinic.   [facility]  [address] Ether Griffins [place], [place] [phone]   Eligibility Requirements You must have lived in [place], [place], or [place] counties for at least the last three months.   You cannot be eligible for state or federal sponsored [place], including [employer], [place], or [employer].   You generally cannot be eligible for healthcare insurance through your employer.    How to apply: Eligibility screenings are held every Tuesday and Wednesday afternoon from 1:00 pm until 4:00 pm. You do not need an appointment  for the interview!  [facility] [address], [place], [place] [phone]   [facility]  [phone]   [facility]  [phone]   [facility] Health Department  [phone]    Behavioral Health Resources in the Community: Intensive Outpatient Programs Organization         Address  Phone  Notes  [facility] Services [address] [place], [place] [phone]   [facility] Outpatient [address] [place], [place] [phone]   [facility]: [facility] Drug S[facility]cs [address], [place], [place]  [phone]   [facility] Mental Health [address]  [place], [place] [phone] or [phone]   Substance Abuse Resources Organization         Address  Phone  Notes  [facility] Drug Services  [phone]   [facility]  [phone]   [facility] [place]  [phone]   Floydene Flock  [phone]   Residential & Outpatient Substance Abuse Program  [phone]   Psychological Services Organization         Address  Phone  Notes  [facility] Behavioral Health  [phone]   [facility] Services  [phone]   [facility] Mental Health [address], [place] [phone] or [phone]    Mobile Crisis Teams Organization         Address  Phone  Notes  [facility], Mobile Crisis Care Unit  [phone]   [facility]  [address] [place], [place] [phone]   Doristine Locks [address] [place] [phone]    Self-Help/Support Groups Organization         Address  Phone  Notes  [facility]oc[facility] of [place] - variety of support groups  [place] Call for more information  Narcotics Anonymous (NA), Caring [facility] [address], [employer] [place]  2 meetings at this location   Special educational needs teacher         Address  Phone  Notes  ASAP Residential  Treatment [place],    [place]  [phone]   [facility]  [address], [place] [phone], [place], [place]   [place], [place] [phone] Admissions: 8am-3pm M-F  Incentives Substance [place] [address],    [place], [place] [phone]   [facility] [address] [place], [place], [place]   [facility] [address]  [place], [place]   Insight Programs - Intensive Outpatient [address] Kristeen [address], [place], [place]   [facility] ([place].,  [place], [place] [phone] or [phone]   [facility] Treatment Services (RTS) [address] [place], [place] Accepts Medicaid  [facility] [place] [address] [place] [address],  [place] [phone] Substance Abuse/Addiction Treatment   [facility] Organization         Address  Phone  Notes  [facility]  [phone]   Domenic Schwab, PhD [address] Arlis Porta [place], [place]   [phone] or [phone]   [place], [place] [phone]   [facility] [address] [place], [place] [phone] Insurance/Medicaid/sponsorship through [facility] and Families [address]                                    [place], [place] [phone] [place] [address], [place] [phone]    Dr. Adele Schilder  [phone]   [facility] Dept. 1) [address], [place] 2) [place] 3)  [place] [address], [place] [phone]  [phone]   [place] [phone] or [phone] (After Hours)

## 2013-08-11 NOTE — ED Provider Notes (Signed)
Medical screening examination/treatment/procedure(s) were performed by non-physician practitioner and as supervising physician I was immediately available for consultation/collaboration.     Raliegh Scobie, MD 08/11/13 1409 

## 2013-10-22 ENCOUNTER — Encounter (HOSPITAL_COMMUNITY): Payer: Self-pay | Admitting: Emergency Medicine

## 2013-10-22 ENCOUNTER — Emergency Department (HOSPITAL_COMMUNITY)
Admission: EM | Admit: 2013-10-22 | Discharge: 2013-10-22 | Disposition: A | Discharge status: Home / Self Care | Payer: Medicaid Other | Attending: Emergency Medicine | Admitting: Emergency Medicine

## 2013-10-22 DIAGNOSIS — Z87448 Personal history of other diseases of urinary system: Secondary | ICD-10-CM | POA: Insufficient documentation

## 2013-10-22 DIAGNOSIS — Z791 Long term (current) use of non-steroidal anti-inflammatories (NSAID): Secondary | ICD-10-CM | POA: Insufficient documentation

## 2013-10-22 DIAGNOSIS — W503XXA Accidental bite by another person, initial encounter: Secondary | ICD-10-CM

## 2013-10-22 DIAGNOSIS — Z23 Encounter for immunization: Secondary | ICD-10-CM | POA: Insufficient documentation

## 2013-10-22 DIAGNOSIS — Z87891 Personal history of nicotine dependence: Secondary | ICD-10-CM | POA: Insufficient documentation

## 2013-10-22 DIAGNOSIS — Z8619 Personal history of other infectious and parasitic diseases: Secondary | ICD-10-CM | POA: Diagnosis not present

## 2013-10-22 DIAGNOSIS — E119 Type 2 diabetes mellitus without complications: Secondary | ICD-10-CM | POA: Insufficient documentation

## 2013-10-22 DIAGNOSIS — IMO0002 Reserved for concepts with insufficient information to code with codable children: Secondary | ICD-10-CM | POA: Diagnosis present

## 2013-10-22 DIAGNOSIS — F17200 Nicotine dependence, unspecified, uncomplicated: Secondary | ICD-10-CM | POA: Diagnosis not present

## 2013-10-22 DIAGNOSIS — Z8659 Personal history of other mental and behavioral disorders: Secondary | ICD-10-CM | POA: Diagnosis not present

## 2013-10-22 MED ORDER — TETANUS-DIPHTH-ACELL PERTUSSIS 5-2.5-18.5 LF-MCG/0.5 IM SUSP
0.5000 mL | Freq: Once | INTRAMUSCULAR | Status: AC
  Administered 2013-10-22: 0.5 mL via INTRAMUSCULAR
  Filled 2013-10-22: qty 0.5

## 2013-10-22 MED ORDER — AMOXICILLIN-POT CLAVULANATE 875-125 MG PO TABS: 1.0000 | ORAL_TABLET | Freq: Two times a day (BID) | ORAL | Status: AC
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
## 2014-01-27 ENCOUNTER — Encounter (HOSPITAL_COMMUNITY): Payer: Self-pay | Admitting: Emergency Medicine

## 2014-01-27 ENCOUNTER — Emergency Department (HOSPITAL_COMMUNITY)
Admission: EM | Admit: 2014-01-27 | Discharge: 2014-01-27 | Disposition: A | Discharge status: Home / Self Care | Payer: Medicaid Other | Attending: Emergency Medicine | Admitting: Emergency Medicine

## 2014-01-27 DIAGNOSIS — N3000 Acute cystitis without hematuria: Secondary | ICD-10-CM | POA: Insufficient documentation

## 2014-01-27 DIAGNOSIS — Z720 Tobacco use: Secondary | ICD-10-CM | POA: Insufficient documentation

## 2014-01-27 DIAGNOSIS — Z8659 Personal history of other mental and behavioral disorders: Secondary | ICD-10-CM | POA: Diagnosis not present

## 2014-01-27 DIAGNOSIS — Z8632 Personal history of gestational diabetes: Secondary | ICD-10-CM | POA: Insufficient documentation

## 2014-01-27 DIAGNOSIS — Z3202 Encounter for pregnancy test, result negative: Secondary | ICD-10-CM | POA: Diagnosis not present

## 2014-01-27 DIAGNOSIS — M545 Low back pain: Secondary | ICD-10-CM | POA: Diagnosis not present

## 2014-01-27 DIAGNOSIS — Z9889 Other specified postprocedural states: Secondary | ICD-10-CM | POA: Insufficient documentation

## 2014-01-27 DIAGNOSIS — A599 Trichomoniasis, unspecified: Secondary | ICD-10-CM | POA: Insufficient documentation

## 2014-01-27 DIAGNOSIS — A64 Unspecified sexually transmitted disease: Secondary | ICD-10-CM

## 2014-01-27 DIAGNOSIS — Z202 Contact with and (suspected) exposure to infections with a predominantly sexual mode of transmission: Secondary | ICD-10-CM | POA: Insufficient documentation

## 2014-01-27 LAB — URINALYSIS, ROUTINE W REFLEX MICROSCOPIC
Bilirubin Urine: NEGATIVE
Glucose, UA: NEGATIVE mg/dL
HGB URINE DIPSTICK: NEGATIVE
KETONES UR: NEGATIVE mg/dL
Nitrite: NEGATIVE
PROTEIN: NEGATIVE mg/dL
Specific Gravity, Urine: 1.023 (ref 1.005–1.030)
UROBILINOGEN UA: 0.2 mg/dL (ref 0.0–1.0)
pH: 6 (ref 5.0–8.0)

## 2014-01-27 LAB — WET PREP, GENITAL: YEAST WET PREP: NONE SEEN

## 2014-01-27 LAB — URINE MICROSCOPIC-ADD ON

## 2014-01-27 LAB — POC URINE PREG, ED: PREG TEST UR: NEGATIVE

## 2014-01-27 MED ORDER — STERILE WATER FOR INJECTION IJ SOLN
INTRAMUSCULAR | Status: AC
  Administered 2014-01-27: 1.5 mL
  Filled 2014-01-27: qty 10

## 2014-01-27 MED ORDER — CEPHALEXIN 250 MG PO CAPS
500.0000 mg | ORAL_CAPSULE | Freq: Once | ORAL | Status: AC
  Administered 2014-01-27: 500 mg via ORAL
  Filled 2014-01-27: qty 2

## 2014-01-27 MED ORDER — CEPHALEXIN 500 MG PO CAPS: 500.0000 mg | ORAL_CAPSULE | Freq: Three times a day (TID) | ORAL | Status: DC

## 2014-01-27 MED ORDER — AZITHROMYCIN 250 MG PO TABS
1000.0000 mg | ORAL_TABLET | Freq: Once | ORAL | Status: AC
  Administered 2014-01-27: 1000 mg via ORAL
  Filled 2014-01-27: qty 4

## 2014-01-27 MED ORDER — METRONIDAZOLE 500 MG PO TABS
2000.0000 mg | ORAL_TABLET | Freq: Once | ORAL | Status: AC
  Administered 2014-01-27: 2000 mg via ORAL
  Filled 2014-01-27: qty 4

## 2014-01-27 MED ORDER — ONDANSETRON 4 MG PO TBDP
4.0000 mg | ORAL_TABLET | Freq: Once | ORAL | Status: AC
  Administered 2014-01-27: 4 mg via ORAL
  Filled 2014-01-27: qty 1

## 2014-01-27 MED ORDER — CEFTRIAXONE SODIUM 250 MG IJ SOLR
250.0000 mg | Freq: Once | INTRAMUSCULAR | Status: AC
  Administered 2014-01-27: 250 mg via INTRAMUSCULAR
  Filled 2014-01-27: qty 250
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
## 2014-01-27 NOTE — Discharge Instructions (Signed)
Trichomoniasis °Trichomoniasis is an infection caused by an organism called Trichomonas. The infection can affect both women and men. In women, the outer female genitalia and the vagina are affected. In men, the penis is mainly affected, but the prostate and other reproductive organs can also be involved. Trichomoniasis is a sexually transmitted infection (STI) and is most often passed to another person through sexual contact.  °RISK FACTORS °· Having unprotected sexual intercourse. °· Having sexual intercourse with an infected partner. °SIGNS AND SYMPTOMS  °Symptoms of trichomoniasis in women include: °· Abnormal gray-green frothy vaginal discharge. °· Itching and irritation of the vagina. °· Itching and irritation of the area outside the vagina. °Symptoms of trichomoniasis in men include:  °· Penile discharge with or without pain. °· Pain during urination. This results from inflammation of the urethra. °DIAGNOSIS  °Trichomoniasis may be found during a Pap test or physical exam. Your health care provider may use one of the following methods to help diagnose this infection: °· Examining vaginal discharge under a microscope. For men, urethral discharge would be examined. °· Testing the pH of the vagina with a test tape. °· Using a vaginal swab test that checks for the Trichomonas organism. A test is available that provides results within a few minutes. °· Doing a culture test for the organism. This is not usually needed. °TREATMENT  °· You may be given medicine to fight the infection. Women should inform their health care provider if they could be or are pregnant. Some medicines used to treat the infection should not be taken during pregnancy. °· Your health care provider may recommend over-the-counter medicines or creams to decrease itching or irritation. °· Your sexual partner will need to be treated if infected. °HOME CARE INSTRUCTIONS  °· Take medicines only as directed by your health care provider. °· Take  over-the-counter medicine for itching or irritation as directed by your health care provider. °· Do not have sexual intercourse while you have the infection. °· Women should not douche or wear tampons while they have the infection. °· Discuss your infection with your partner. Your partner may have gotten the infection from you, or you may have gotten it from your partner. °· Have your sex partner get examined and treated if necessary. °· Practice safe, informed, and protected sex. °· See your health care provider for other STI testing. °SEEK MEDICAL CARE IF:  °· You still have symptoms after you finish your medicine. °· You develop abdominal pain. °· You have pain when you urinate. °· You have bleeding after sexual intercourse. °· You develop a rash. °· Your medicine makes you sick or makes you throw up (vomit). °MAKE SURE YOU: °· Understand these instructions. °· Will watch your condition. °· Will get help right away if you are not doing well or get worse. °Document Released: 09/26/2000 Document Revised: 08/17/2013 Document Reviewed: 01/12/2013 °ExitCare® Patient Information ©2015 [employer]. This information is not intended to replace advice given to you by your health care provider. Make sure you discuss any questions you have with your health care provider. ° °Urinary Tract Infection °Urinary tract infections (UTIs) can develop anywhere along your urinary tract. Your urinary tract is your body's drainage system for removing wastes and extra water. Your urinary tract includes two kidneys, two ureters, a bladder, and a urethra. Your kidneys are a pair of bean-shaped organs. Each kidney is about the size of your fist. They are located below your ribs, one on each side of your spine. °CAUSES °Infections   are caused by microbes, which are microscopic organisms, including fungi, viruses, and bacteria. These organisms are so small that they can only be seen through a microscope. Bacteria are the microbes that most  commonly cause UTIs. SYMPTOMS  Symptoms of UTIs may vary by age and gender of the patient and by the location of the infection. Symptoms in young women typically include a frequent and intense urge to urinate and a painful, burning feeling in the bladder or urethra during urination. Older women and men are more likely to be tired, shaky, and weak and have muscle aches and abdominal pain. A fever may mean the infection is in your kidneys. Other symptoms of a kidney infection include pain in your back or sides below the ribs, nausea, and vomiting. DIAGNOSIS To diagnose a UTI, your caregiver will ask you about your symptoms. Your caregiver also will ask to provide a urine sample. The urine sample will be tested for bacteria and white blood cells. White blood cells are made by your body to help fight infection. TREATMENT  Typically, UTIs can be treated with medication. Because most UTIs are caused by a bacterial infection, they usually can be treated with the use of antibiotics. The choice of antibiotic and length of treatment depend on your symptoms and the type of bacteria causing your infection. HOME CARE INSTRUCTIONS  If you were prescribed antibiotics, take them exactly as your caregiver instructs you. Finish the medication even if you feel better after you have only taken some of the medication.  Drink enough water and fluids to keep your urine clear or pale yellow.  Avoid caffeine, tea, and carbonated beverages. They tend to irritate your bladder.  Empty your bladder often. Avoid holding urine for long periods of time.  Empty your bladder before and after sexual intercourse.  After a bowel movement, women should cleanse from front to back. Use each tissue only once. SEEK MEDICAL CARE IF:   You have back pain.  You develop a fever.  Your symptoms do not begin to resolve within 3 days. SEEK IMMEDIATE MEDICAL CARE IF:   You have severe back pain or lower abdominal pain.  You develop  chills.  You have nausea or vomiting.  You have continued burning or discomfort with urination. MAKE SURE YOU:   Understand these instructions.  Will watch your condition.  Will get help right away if you are not doing well or get worse. Document Released: 01/10/2005 Document Revised: 10/02/2011 Document Reviewed: 05/11/2011 [facility] F[facility] Patient Information 2015 [place], [place]. This information is not intended to replace advice given to you by your health care provider. Make sure you discuss any questions you have with your health care provider.  Sexually Transmitted Disease A sexually transmitted disease (STD) is a disease or infection that may be passed (transmitted) from person to person, usually during sexual activity. This may happen by way of saliva, semen, blood, vaginal mucus, or urine. Common STDs include:   Gonorrhea.   Chlamydia.   Syphilis.   HIV and AIDS.   Genital herpes.   Hepatitis B and C.   Trichomonas.   Human papillomavirus (HPV).   Pubic lice.   Scabies.  Mites.  Bacterial vaginosis. WHAT ARE CAUSES OF STDs? An STD may be caused by bacteria, a virus, or parasites. STDs are often transmitted during sexual activity if one person is infected. However, they may also be transmitted through nonsexual means. STDs may be transmitted after:   Sexual intercourse with an infected person.  Sharing sex toys with an infected person.   Sharing needles with an infected person or using unclean piercing or tattoo needles.  Having intimate contact with the genitals, mouth, or rectal areas of an infected person.   Exposure to infected fluids during birth. WHAT ARE THE SIGNS AND SYMPTOMS OF STDs? Different STDs have different symptoms. Some people may not have any symptoms. If symptoms are present, they may include:   Painful or bloody urination.   Pain in the pelvis, abdomen, vagina, anus, throat, or eyes.   A skin rash, itching, or  irritation.  Growths, ulcerations, blisters, or sores in the genital and anal areas.  Abnormal vaginal discharge with or without bad odor.   Penile discharge in men.   Fever.   Pain or bleeding during sexual intercourse.   Swollen glands in the groin area.   Yellow skin and eyes (jaundice). This is seen with hepatitis.   Swollen testicles.  Infertility.  Sores and blisters in the mouth. HOW ARE STDs DIAGNOSED? To make a diagnosis, your health care provider may:   Take a medical history.   Perform a physical exam.   Take a sample of any discharge to examine.  Swab the throat, cervix, opening to the penis, rectum, or vagina for testing.  Test a sample of your first morning urine.   Perform blood tests.   Perform a Pap test, if this applies.   Perform a colposcopy.   Perform a laparoscopy.  HOW ARE STDs TREATED? Treatment depends on the STD. Some STDs may be treated but not cured.   Chlamydia, gonorrhea, trichomonas, and syphilis can be cured with antibiotic medicine.   Genital herpes, hepatitis, and HIV can be treated, but not cured, with prescribed medicines. The medicines lessen symptoms.   Genital warts from HPV can be treated with medicine or by freezing, burning (electrocautery), or surgery. Warts may come back.   HPV cannot be cured with medicine or surgery. However, abnormal areas may be removed from the cervix, vagina, or vulva.   If your diagnosis is confirmed, your recent sexual partners need treatment. This is true even if they are symptom-free or have a negative culture or evaluation. They should not have sex until their health care providers say it is okay. HOW CAN I REDUCE MY RISK OF GETTING AN STD? Take these steps to reduce your risk of getting an STD:  Use latex condoms, dental dams, and water-soluble lubricants during sexual activity. Do not use petroleum jelly or oils.  Avoid having multiple sex partners.  Do not have sex  with someone who has other sex partners.  Do not have sex with anyone you do not know or who is at high risk for an STD.  Avoid risky sex practices that can break your skin.  Do not have sex if you have open sores on your mouth or skin.  Avoid drinking too much alcohol or taking illegal drugs. Alcohol and drugs can affect your judgment and put you in a vulnerable position.  Avoid engaging in oral and anal sex acts.  Get vaccinated for HPV and hepatitis. If you have not received these vaccines in the past, talk to your health care provider about whether one or both might be right for you.   If you are at risk of being infected with HIV, it is recommended that you take a prescription medicine daily to prevent HIV infection. This is called pre-exposure prophylaxis (PrEP). You are considered at risk if:  You are  a man who has sex with other men (MSM).  You are a heterosexual man or woman and are sexually active with more than one partner.  You take drugs by injection.  You are sexually active with a partner who has HIV.  Talk with your health care provider about whether you are at high risk of being infected with HIV. If you choose to begin PrEP, you should first be tested for HIV. You should then be tested every 3 months for as long as you are taking PrEP.  WHAT SHOULD I DO IF I THINK I HAVE AN STD?  See your health care provider.   Tell your sexual partner(s). They should be tested and treated for any STDs.  Do not have sex until your health care provider says it is okay. WHEN SHOULD I GET IMMEDIATE MEDICAL CARE? Contact your health care provider right away if:   You have severe abdominal pain.  You are a man and notice swelling or pain in your testicles.  You are a woman and notice swelling or pain in your vagina. Document Released: 06/23/2002 Document Revised: 04/07/2013 Document Reviewed: 10/21/2012 [facility]ExitCare Patient Information 2015 [employer], [place]. This information is  not intended to replace advice given to you by your health care provider. Make sure you discuss any questions you have with your health care provider.

## 2014-01-27 NOTE — ED Notes (Signed)
EDP at bedside to perform pelvic exam, cultures sent to lab for testing. Pt tolerated without difficulty.
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
## 2014-01-28 LAB — GC/CHLAMYDIA PROBE AMP
CT PROBE, AMP APTIMA: NEGATIVE
GC PROBE AMP APTIMA: NEGATIVE

## 2014-02-15 ENCOUNTER — Encounter (HOSPITAL_COMMUNITY): Payer: Self-pay | Admitting: Emergency Medicine

## 2015-05-20 ENCOUNTER — Encounter (HOSPITAL_COMMUNITY): Payer: Self-pay | Admitting: *Deleted

## 2015-05-20 ENCOUNTER — Inpatient Hospital Stay (HOSPITAL_COMMUNITY)
Admission: AD | Admit: 2015-05-20 | Discharge: 2015-05-20 | Disposition: A | Discharge status: Home / Self Care | Payer: Medicaid Other | Source: Ambulatory Visit | Attending: Obstetrics & Gynecology | Admitting: Obstetrics & Gynecology

## 2015-05-20 DIAGNOSIS — B009 Herpesviral infection, unspecified: Secondary | ICD-10-CM | POA: Diagnosis not present

## 2015-05-20 DIAGNOSIS — A6000 Herpesviral infection of urogenital system, unspecified: Secondary | ICD-10-CM | POA: Diagnosis not present

## 2015-05-20 DIAGNOSIS — F17210 Nicotine dependence, cigarettes, uncomplicated: Secondary | ICD-10-CM | POA: Diagnosis not present

## 2015-05-20 DIAGNOSIS — R102 Pelvic and perineal pain: Secondary | ICD-10-CM | POA: Diagnosis present

## 2015-05-20 LAB — POCT PREGNANCY, URINE: PREG TEST UR: NEGATIVE

## 2015-05-20 MED ORDER — ACYCLOVIR 400 MG PO TABS: 400.0000 mg | ORAL_TABLET | Freq: Three times a day (TID) | ORAL | Status: DC

## 2015-05-20 NOTE — MAU Note (Signed)
Pt reports that she had a "sore" on her "bottom"

## 2015-05-20 NOTE — Discharge Instructions (Signed)

## 2015-05-20 NOTE — MAU Provider Note (Signed)
  History     CSN: [id number]  Arrival date and time: 05/20/15 0600   First Provider Initiated Contact with Patient 05/20/15 [phone]      Chief Complaint  Patient presents with  . Vaginal Pain   Vaginal Pain The patient's primary symptoms include genital lesions. This is a new problem. Episode onset: 2 weeks ago  The problem occurs constantly. The problem has been unchanged. Pain severity now: 3/10. She is not pregnant. Pertinent negatives include no abdominal pain, chills, constipation, diarrhea, dysuria, fever, frequency, nausea, urgency or vomiting. The vaginal discharge was normal. There has been no bleeding. Nothing aggravates the symptoms. Treatments tried: desitin  The treatment provided no relief. She is sexually active. It is possible that her partner has an STD. She uses tubal ligation for contraception. Her menstrual history has been regular (LMP 04/29/15 ).    Past Medical History  Diagnosis Date  . History of bladder infections   . Abnormal Pap smear   . Trichomonas   . Diabetes mellitus in pregnancy (HCC) 2009  . Depression     no meds, seeing counselor  . Shortness of breath     pregnancy related    Past Surgical History  Procedure Laterality Date  . Cesarean section      Family History  Problem Relation Age of Onset  . Hypertension Mother   . Diabetes Sister     Social History  Substance Use Topics  . Smoking status: Current Every Day Smoker -- 0.20 packs/day    Types: Cigarettes    Last Attempt to Quit: 01/14/2011  . Smokeless tobacco: Never Used  . Alcohol Use: No    Allergies: No Known Allergies  Prescriptions prior to admission  Medication Sig Dispense Refill Last Dose  . cephALEXin (KEFLEX) 500 MG capsule Take 1 capsule (500 mg total) by mouth 3 (three) times daily. 15 capsule 0     Review of Systems  Constitutional: Negative for fever and chills.  Gastrointestinal: Negative for nausea, vomiting, abdominal pain, diarrhea and constipation.   Genitourinary: Positive for vaginal pain. Negative for dysuria, urgency and frequency.   Physical Exam   Temperature 98.1 F (36.7 C), resp. rate 16, height  (1.626 m), weight 83.915 kg (185 lb).  Physical Exam  Nursing note and vitals reviewed. Constitutional: She is oriented to person, place, and time. She appears well-developed and well-nourished. No distress.  HENT:  Head: Normocephalic.  Cardiovascular: Normal rate.   GI: Soft. There is no tenderness.  Genitourinary:  Small peri-rectal herpatic appearing lesion. HSV PCR collected.   Neurological: She is alert and oriented to person, place, and time.  Skin: Skin is warm and dry.  Psychiatric: She has a normal mood and affect.    MAU Course  Procedures  MDM   Assessment and Plan   1. HSV (herpes simplex virus) infection    DC home Comfort measures reviewed  RX: acyclovir  TID x 10 days #30  Return to MAU as needed   Follow-up Information    Follow up with [facility].   Why:  If symptoms worsen   Contact information:   [address] [place] [address] [phone]         Tawnya Crook 05/20/2015, 6:52 AM

## 2015-05-22 LAB — HERPES SIMPLEX VIRUS(HSV) DNA BY PCR
HSV 1 DNA: NEGATIVE
HSV 2 DNA: NEGATIVE

## 2015-05-25 ENCOUNTER — Ambulatory Visit: Payer: Medicaid Other | Admitting: Obstetrics & Gynecology

## 2015-05-31 ENCOUNTER — Inpatient Hospital Stay (HOSPITAL_COMMUNITY)
Admission: AD | Admit: 2015-05-31 | Discharge: 2015-05-31 | Disposition: A | Discharge status: Home / Self Care | Payer: Medicaid Other | Source: Ambulatory Visit | Attending: Obstetrics & Gynecology | Admitting: Obstetrics & Gynecology

## 2015-05-31 ENCOUNTER — Encounter (HOSPITAL_COMMUNITY): Payer: Self-pay | Admitting: Student

## 2015-05-31 DIAGNOSIS — Z3202 Encounter for pregnancy test, result negative: Secondary | ICD-10-CM | POA: Diagnosis not present

## 2015-05-31 DIAGNOSIS — L259 Unspecified contact dermatitis, unspecified cause: Secondary | ICD-10-CM | POA: Insufficient documentation

## 2015-05-31 DIAGNOSIS — K629 Disease of anus and rectum, unspecified: Secondary | ICD-10-CM | POA: Diagnosis present

## 2015-05-31 DIAGNOSIS — F17210 Nicotine dependence, cigarettes, uncomplicated: Secondary | ICD-10-CM | POA: Diagnosis not present

## 2015-05-31 DIAGNOSIS — A6000 Herpesviral infection of urogenital system, unspecified: Secondary | ICD-10-CM | POA: Insufficient documentation

## 2015-05-31 LAB — URINALYSIS, ROUTINE W REFLEX MICROSCOPIC
BILIRUBIN URINE: NEGATIVE
Glucose, UA: NEGATIVE mg/dL
HGB URINE DIPSTICK: NEGATIVE
KETONES UR: NEGATIVE mg/dL
Leukocytes, UA: NEGATIVE
NITRITE: NEGATIVE
PROTEIN: NEGATIVE mg/dL
Specific Gravity, Urine: 1.025 (ref 1.005–1.030)
pH: 6.5 (ref 5.0–8.0)

## 2015-05-31 LAB — POCT PREGNANCY, URINE: PREG TEST UR: NEGATIVE

## 2015-05-31 MED ORDER — TRIAMCINOLONE ACETONIDE 0.5 % EX CREA
TOPICAL_CREAM | Freq: Three times a day (TID) | CUTANEOUS | Status: DC
  Administered 2015-05-31: 22:00:00 via TOPICAL
  Filled 2015-05-31: qty 15

## 2015-05-31 MED ORDER — LIDOCAINE HCL 2 % EX GEL
1.0000 "application " | Freq: Once | CUTANEOUS | Status: AC
  Administered 2015-05-31: 1 via TOPICAL
  Filled 2015-05-31: qty 5

## 2015-05-31 NOTE — MAU Provider Note (Signed)
Attestation signed by Hurshel Party, CNM at 05/31/2015 8:13 PM  CNM attestation:  I have seen and examined this patient; I agree with above documentation in the medical student's note.   Colleen Russell is a 35 y.o. [phone] reporting painful lesions in her anal area x 3-4 weeks that are not improved by acyclovir, prescribed in her ED visit 05/20/15. HSV swab was negative on that visit. The lesions are painful and itching, especially when wiping. She denies changes to soap/detergent but started using lavender scented toilet paper ~2 months ago. She has never had lesions like this before and is monogamous with 1 partner x 3 years. She is tearful and upset that she may have an STD and afraid that something more serious is wrong since it is not getting better.  Denies VB, cramping, urinary symptoms, or fever/chills.   PE: BP 139/89 mmHg  Pulse 80  Temp(Src) 98.2 F (36.8 C) (Oral)  Resp 18  LMP 05/23/2015 Gen: calm comfortable, NAD Resp: normal effort, no distress Abd: soft, nontender  Pelvic exam reveals several raised flesh colored lesions in the perianal area and between gluteal folds with white plaque-like covering with mild edema and significant erythema surrounding lesions. Lesions vary in size with irregular borders. Dr Macon Large to bedside to evaluate. HSV swab recollected today.   ROS, labs, PMH reviewed   Plan: Labs drawn: HIV, RPR, HSV antibodies Pt left MAU to pick up her children but plans to return tonight Consult Anyanwu who came to bedside to evaluate lesions Plan to treat with topical triamcinolone 0.5% TID and lidocaine 2% jelly TID PRN D/C home Follow up in [facility] in 2 weeks, message sent to clinic Return to MAU as needed for emergencies   LEFTWICH-KIRBY, Cambryn Charters, CNM 8:04 PM     Expand All Collapse All   Chief Complaint:    SUBJECTIVE HPI: Colleen Russell is a 35 y.o. [phone] at who presents to maternity admissions reporting white blister-like  lesions on her "bottom." for the last 3-4 wks. She came to the ED on 05/20/15 with the same complaint and was started on acyclovir  TID x10 days. HSV 1 and 2 PCR came back negative and she was told to stop the medication. Came in today because lesions are still present and continue to be itching and irritating, particularly while wiping. Denies changes to the lesions but has not looked at the lesion, dysuria, vaginal bleeding, fevers, or n/v/d. Has never had lesions like this in the past.  Has been with same sexual and live-in partner for 3 years. Uses BTL for contraception. Does not use condoms. Partner does not know about the lesions or that she has been on acyclovir. Pt feels safe at home.    HPI  Past Medical History  Diagnosis Date  . History of bladder infections   . Abnormal Pap smear   . Trichomonas   . Diabetes mellitus in pregnancy (HCC) 2009  . Depression     no meds, seeing counselor  . Shortness of breath     pregnancy related   Past Surgical History  Procedure Laterality Date  . Cesarean section  2013    with BTL   Social History   Social History  . Marital Status: Single - Same female partner x3 years    Spouse Name: N/A  . Number of Children: 3 (2 with current partner)  . Years of Education: N/A   Occupational History  . Not on file.   Social History Main  Topics  . Smoking status: Current Every Day Smoker -- 0.20 packs/day    Types: Cigarettes    Last Attempt to Quit: 01/14/2011  . Smokeless tobacco: Never Used  . Alcohol Use: No  . Drug Use: No  . Sexual Activity: Yes    Birth Control/ Protection: BTL    No current facility-administered medications on file prior to encounter.   Current Outpatient Prescriptions on File Prior to Encounter  Medication Sig Dispense Refill  . acyclovir (ZOVIRAX) 400 MG tablet Take 1 tablet (400 mg total) by mouth 3  (three) times daily. 30 tablet 0   No Known Allergies  ROS:  Review of Systems  See HPI  I have reviewed patient's Past Medical Hx, Surgical Hx, Family Hx, Social Hx, medications and allergies.   Physical Exam   Patient Vitals for the past 24 hrs:  BP Temp Temp src Pulse Resp  05/31/15 1559 139/89 mmHg 98.2 F (36.8 C) Oral 80 18   Constitutional: Female in no acute distress, appears worried and tearful when discussing the lesions and that her partner doesn't know about it  Cardiovascular: RRR, no murmurs, rubs, or gallops Respiratory: Clear to auscultation bilatearlly GI: Abd soft, non-tender. Pos BS x 4 External genitalia exam: 2-3 hypopigmented, domed lesions measuring 2-3 mm on perineum/inner buttock; 2 hypopigmented edematous plaques (no scale) on inner buttock opposite each other, R lesion erythematous and slightly ulcerated, measuring 1-1.5 cm, clear and roundly, irregular borders, tender, and lesions likely rub together based on position  LAB RESULTS  Lab Results Last 24 Hours    Results for orders placed or performed during the hospital encounter of 05/31/15 (from the past 24 hour(s))  Urinalysis, Routine w reflex microscopic (not at [facility]) Status: None   Collection Time: 05/31/15 4:15 PM  Result Value Ref Range   Color, Urine YELLOW YELLOW   APPearance CLEAR CLEAR   Specific Gravity, Urine 1.025 1.005 - 1.030   pH 6.5 5.0 - 8.0   Glucose, UA NEGATIVE NEGATIVE mg/dL   Hgb urine dipstick NEGATIVE NEGATIVE   Bilirubin Urine NEGATIVE NEGATIVE   Ketones, ur NEGATIVE NEGATIVE mg/dL   Protein, ur NEGATIVE NEGATIVE mg/dL   Nitrite NEGATIVE NEGATIVE   Leukocytes, UA NEGATIVE NEGATIVE  Pregnancy, urine POC Status: None   Collection Time: 05/31/15 4:55 PM  Result Value Ref Range   Preg Test, Ur NEGATIVE NEGATIVE        MAU Management/MDM: U/a and pregnancy test negative.   External genitalia exam performed with CNM and Dr. Macon Large Ordered serum labs.  Pt stable at time of discharge.  Lesions are difficult to classify as they have been present for 3-4 weeks and are located in moist area with constant friction. Pt has no systemic findings.  Differential includes genital herpes, contact dermatitis, eczema, HPV, lichen planus, syphilis, and other dermatologic findings (e.g. Behcet). Inflammatory etiologies, like contact dermatitis or eczema possible with use of lavender toilet paper for the last 2-3 months and edematous appearance. While HSV 1&2 DNA PCR were negative on 05/20/15, does not rule out HSV infection. Further lab work will help evaluate possible etiologies.  ASSESSMENT Contact dermatitis vs. HSV  PLAN Draw labs HSV 1 and 2 antibodies, HIV, and RPR. Discharge home with topical lidocaine and steroids Follow-up in [facility] clinic to evaluate for improvement on steroids and consider biopsy    Medication List    ASK your doctor about these medications       acyclovir 400 MG tablet  Commonly known  as: ZOVIRAX  Take 1 tablet (400 mg total) by mouth 3 (three) times daily.         Garnette Czech, MS3  05/31/2015  5:17 PM

## 2015-05-31 NOTE — MAU Note (Signed)
Here 2/3  Thought she had herpes, all her tests were neg.  Was called and told to stop the medicine.  Still having symptoms; sore and blisters.  Wants  To know what is going on, is embarrassed.  States she seems to stay moist and she is itching, everything rubbs and is irriating

## 2015-05-31 NOTE — Progress Notes (Signed)
Chief Complaint:     SUBJECTIVE HPI: Colleen Russell is a 35 y.o. [phone] at who presents to maternity admissions reporting white blister-like lesions on her "bottom." for the last 3-4 wks. She came to the ED on 05/20/15 with the same complaint and was started on acyclovir  TID x10 days. HSV 1 and 2 PCR came back negative and she was told to stop the medication. Came in today because lesions are still present and continue to be itching and irritating, particularly while wiping. Denies changes to the lesions but has not looked at the lesion, dysuria, vaginal bleeding, fevers, or n/v/d. Has never had lesions like this in the past.  Has been with same sexual and live-in partner for 3 years. Uses BTL for contraception. Does not use condoms. Partner does not know about the lesions or that she has been on acyclovir. Pt feels safe at home.     HPI  Past Medical History  Diagnosis Date  . History of bladder infections   . Abnormal Pap smear   . Trichomonas   . Diabetes mellitus in pregnancy (HCC) 2009  . Depression     no meds, seeing counselor  . Shortness of breath     pregnancy related   Past Surgical History  Procedure Laterality Date  . Cesarean section  2013    with BTL   Social History   Social History  . Marital Status: Single - Same female partner x3 years    Spouse Name: N/A  . Number of Children: 3 (2 with current partner)  . Years of Education: N/A   Occupational History  . Not on file.   Social History Main Topics  . Smoking status: Current Every Day Smoker -- 0.20 packs/day    Types: Cigarettes    Last Attempt to Quit: 01/14/2011  . Smokeless tobacco: Never Used  . Alcohol Use: No  . Drug Use: No  . Sexual Activity: Yes    Birth Control/ Protection: BTL    No current facility-administered medications on file prior to encounter.   Current Outpatient Prescriptions on File Prior to Encounter  Medication Sig Dispense Refill  . acyclovir (ZOVIRAX) 400 MG  tablet Take 1 tablet (400 mg total) by mouth 3 (three) times daily. 30 tablet 0   No Known Allergies  ROS:  Review of Systems  See HPI  I have reviewed patient's Past Medical Hx, Surgical Hx, Family Hx, Social Hx, medications and allergies.   Physical Exam   Patient Vitals for the past 24 hrs:  BP Temp Temp src Pulse Resp  05/31/15 1559 139/89 mmHg 98.2 F (36.8 C) Oral 80 18   Constitutional: Female in no acute distress, appears worried and tearful when discussing the lesions and that her partner doesn't know about it  Cardiovascular: RRR, no murmurs, rubs, or gallops Respiratory: Clear to auscultation bilatearlly GI: Abd soft, non-tender. Pos BS x 4 External genitalia exam: 2-3 hypopigmented, domed lesions measuring 2-3 mm on perineum/inner buttock; 2 hypopigmented edematous plaques (no scale) on inner buttock opposite each other, R lesion erythematous and slightly ulcerated, measuring 1-1.5 cm, clear and roundly, irregular borders, tender, and lesions likely rub together based on position  LAB RESULTS Results for orders placed or performed during the hospital encounter of 05/31/15 (from the past 24 hour(s))  Urinalysis, Routine w reflex microscopic (not at [facility])     Status: None   Collection Time: 05/31/15  4:15 PM  Result Value Ref Range   Color, Urine YELLOW  YELLOW   APPearance CLEAR CLEAR   Specific Gravity, Urine 1.025 1.005 - 1.030   pH 6.5 5.0 - 8.0   Glucose, UA NEGATIVE NEGATIVE mg/dL   Hgb urine dipstick NEGATIVE NEGATIVE   Bilirubin Urine NEGATIVE NEGATIVE   Ketones, ur NEGATIVE NEGATIVE mg/dL   Protein, ur NEGATIVE NEGATIVE mg/dL   Nitrite NEGATIVE NEGATIVE   Leukocytes, UA NEGATIVE NEGATIVE  Pregnancy, urine POC     Status: None   Collection Time: 05/31/15  4:55 PM  Result Value Ref Range   Preg Test, Ur NEGATIVE NEGATIVE      MAU Management/MDM: U/a and pregnancy test negative.  External genitalia exam performed with CNM and Dr. Macon Large Ordered serum  labs.  Pt stable at time of discharge.  Lesions are difficult to classify as they have been present for 3-4 weeks and are located in moist area with constant friction. Pt has no systemic findings.  Differential includes genital herpes, contact dermatitis, eczema, HPV, lichen planus, syphilis, and other dermatologic findings (e.g. Behcet). Inflammatory etiologies, like contact dermatitis or eczema possible with use of lavender toilet paper for the last 2-3 months and edematous appearance. While HSV 1&2 DNA PCR were negative on 05/20/15, does not rule out HSV infection. Further lab work will help evaluate possible etiologies.  ASSESSMENT Contact dermatitis vs. HSV  PLAN Draw labs EBV, HSV 1 and 2 antibodies, HIV, and RPR. Discharge home with topical lidocaine and steroids Follow-up in [facility] OB[facility]Gyn clinic to evaluate for improvement on steroids and consider biopsy     Medication List    ASK your doctor about these medications        acyclovir 400 MG tablet  Commonly known as:  ZOVIRAX  Take 1 tablet (400 mg total) by mouth 3 (three) times daily.         Garnette Czech, MS3  05/31/2015  5:17 PM

## 2015-05-31 NOTE — Discharge Instructions (Signed)
Contact Dermatitis Dermatitis is redness, soreness, and swelling (inflammation) of the skin. Contact dermatitis is a reaction to certain substances that touch the skin. There are two types of contact dermatitis:   Irritant contact dermatitis. This type is caused by something that irritates your skin, such as dry hands from washing them too much. This type does not require previous exposure to the substance for a reaction to occur. This type is more common.  Allergic contact dermatitis. This type is caused by a substance that you are allergic to, such as a nickel allergy or poison ivy. This type only occurs if you have been exposed to the substance (allergen) before. Upon a repeat exposure, your body reacts to the substance. This type is less common. CAUSES  Many different substances can cause contact dermatitis. Irritant contact dermatitis is most commonly caused by exposure to:   Makeup.   Soaps.   Detergents.   Bleaches.   Acids.   Metal salts, such as nickel.  Allergic contact dermatitis is most commonly caused by exposure to:   Poisonous plants.   Chemicals.   Jewelry.   Latex.   Medicines.   Preservatives in products, such as clothing.  RISK FACTORS This condition is more likely to develop in:   People who have jobs that expose them to irritants or allergens.  People who have certain medical conditions, such as asthma or eczema.  SYMPTOMS  Symptoms of this condition may occur anywhere on your body where the irritant has touched you or is touched by you. Symptoms include:  Dryness or flaking.   Redness.   Cracks.   Itching.   Pain or a burning feeling.   Blisters.  Drainage of small amounts of blood or clear fluid from skin cracks. With allergic contact dermatitis, there may also be swelling in areas such as the eyelids, mouth, or genitals.  DIAGNOSIS  This condition is diagnosed with a medical history and physical exam. A patch skin test  may be performed to help determine the cause. If the condition is related to your job, you may need to see an occupational medicine specialist. TREATMENT Treatment for this condition includes figuring out what caused the reaction and protecting your skin from further contact. Treatment may also include:   Steroid creams or ointments. Oral steroid medicines may be needed in more severe cases.  Antibiotics or antibacterial ointments, if a skin infection is present.  Antihistamine lotion or an antihistamine taken by mouth to ease itching.  A bandage (dressing). HOME CARE INSTRUCTIONS Skin Care  Moisturize your skin as needed.   Apply cool compresses to the affected areas.  Try taking a bath with:  Epsom salts. Follow the instructions on the packaging. You can get these at your local pharmacy or grocery store.  Baking soda. Pour a small amount into the bath as directed by your health care provider.  Colloidal oatmeal. Follow the instructions on the packaging. You can get this at your local pharmacy or grocery store.  Try applying baking soda paste to your skin. Stir water into baking soda until it reaches a paste-like consistency.  Do not scratch your skin.  Bathe less frequently, such as every other day.  Bathe in lukewarm water. Avoid using hot water. Medicines  Take or apply over-the-counter and prescription medicines only as told by your health care provider.   If you were prescribed an antibiotic medicine, take or apply your antibiotic as told by your health care provider. Do not stop using the   antibiotic even if your condition starts to improve. General Instructions  Keep all follow-up visits as told by your health care provider. This is important.  Avoid the substance that caused your reaction. If you do not know what caused it, keep a journal to try to track what caused it. Write down:  What you eat.  What cosmetic products you use.  What you drink.  What  you wear in the affected area. This includes jewelry.  If you were given a dressing, take care of it as told by your health care provider. This includes when to change and remove it. SEEK MEDICAL CARE IF:   Your condition does not improve with treatment.  Your condition gets worse.  You have signs of infection such as swelling, tenderness, redness, soreness, or warmth in the affected area.  You have a fever.  You have new symptoms. SEEK IMMEDIATE MEDICAL CARE IF:   You have a severe headache, neck pain, or neck stiffness.  You vomit.  You feel very sleepy.  You notice red streaks coming from the affected area.  Your bone or joint underneath the affected area becomes painful after the skin has healed.  The affected area turns darker.  You have difficulty breathing.   This information is not intended to replace advice given to you by your health care provider. Make sure you discuss any questions you have with your health care provider.   Document Released: 03/30/2000 Document Revised: 12/22/2014 Document Reviewed: 08/18/2014 Elsevier Interactive Patient Education 2016 [employer]  

## 2015-06-02 ENCOUNTER — Telehealth: Payer: Self-pay | Admitting: Advanced Practice Midwife

## 2015-06-02 LAB — HERPES SIMPLEX VIRUS CULTURE
CULTURE: NOT DETECTED
SPECIAL REQUESTS: NORMAL

## 2015-06-02 LAB — HIV ANTIBODY (ROUTINE TESTING W REFLEX): HIV Screen 4th Generation wRfx: NONREACTIVE

## 2015-06-02 NOTE — Telephone Encounter (Signed)
Called pt to discuss negative HSV culture, HSV serum tests still pending.  Pt reports significant improvement in symptoms with topical steroid.  Plan to f/u in [facility] if symptoms persist.

## 2015-06-03 ENCOUNTER — Telehealth (HOSPITAL_COMMUNITY): Payer: Self-pay

## 2015-06-03 LAB — HSV 1 ANTIBODY, IGG: HSV 1 Glycoprotein G Ab, IgG: 19.1 index — ABNORMAL HIGH (ref 0.00–0.90)

## 2015-06-03 LAB — HSV 2 ANTIBODY, IGG: HSV 2 Glycoprotein G Ab, IgG: <0.91 index (ref 0.00–0.90)

## 2015-06-03 LAB — RPR, QUANT+TP ABS (REFLEX)
Rapid Plasma Reagin, Quant: 1:32 {titer} — ABNORMAL HIGH
TREPONEMA PALLIDUM AB: POSITIVE — AB

## 2015-06-03 LAB — RPR: RPR Ser Ql: REACTIVE — AB

## 2015-06-03 NOTE — Telephone Encounter (Signed)
Denyse Amass w/[facility] calling to f/u w/positive RPR.  Current Clinical research associate not showing and documentation that pt has been notified or treated.  Demographic info provided

## 2015-06-05 ENCOUNTER — Telehealth: Payer: Self-pay | Admitting: Obstetrics and Gynecology

## 2015-06-05 NOTE — Telephone Encounter (Signed)
Patient informed of + RPR with confirmation T. Pallidum ABS. Patient needs treatment; HD number given to the patient to call to schedule appointment. All questions answered.

## 2015-06-12 ENCOUNTER — Encounter (HOSPITAL_COMMUNITY): Payer: Self-pay | Admitting: Emergency Medicine

## 2015-06-12 ENCOUNTER — Emergency Department (HOSPITAL_COMMUNITY)
Admission: EM | Admit: 2015-06-12 | Discharge: 2015-06-12 | Disposition: A | Discharge status: Home / Self Care | Payer: Medicaid Other | Attending: Emergency Medicine | Admitting: Emergency Medicine

## 2015-06-12 DIAGNOSIS — X58XXXA Exposure to other specified factors, initial encounter: Secondary | ICD-10-CM | POA: Diagnosis not present

## 2015-06-12 DIAGNOSIS — Z8659 Personal history of other mental and behavioral disorders: Secondary | ICD-10-CM | POA: Diagnosis not present

## 2015-06-12 DIAGNOSIS — F17210 Nicotine dependence, cigarettes, uncomplicated: Secondary | ICD-10-CM | POA: Diagnosis not present

## 2015-06-12 DIAGNOSIS — Z8619 Personal history of other infectious and parasitic diseases: Secondary | ICD-10-CM | POA: Insufficient documentation

## 2015-06-12 DIAGNOSIS — S3992XA Unspecified injury of lower back, initial encounter: Secondary | ICD-10-CM | POA: Diagnosis present

## 2015-06-12 DIAGNOSIS — Y9289 Other specified places as the place of occurrence of the external cause: Secondary | ICD-10-CM | POA: Insufficient documentation

## 2015-06-12 DIAGNOSIS — Y9389 Activity, other specified: Secondary | ICD-10-CM | POA: Diagnosis not present

## 2015-06-12 DIAGNOSIS — S39012A Strain of muscle, fascia and tendon of lower back, initial encounter: Secondary | ICD-10-CM | POA: Insufficient documentation

## 2015-06-12 DIAGNOSIS — Z79899 Other long term (current) drug therapy: Secondary | ICD-10-CM | POA: Insufficient documentation

## 2015-06-12 DIAGNOSIS — M545 Low back pain, unspecified: Secondary | ICD-10-CM

## 2015-06-12 DIAGNOSIS — Y998 Other external cause status: Secondary | ICD-10-CM | POA: Diagnosis not present

## 2015-06-12 DIAGNOSIS — T148XXA Other injury of unspecified body region, initial encounter: Secondary | ICD-10-CM

## 2015-06-12 MED ORDER — IBUPROFEN 800 MG PO TABS: 800.0000 mg | ORAL_TABLET | Freq: Three times a day (TID) | ORAL | Status: DC

## 2015-06-12 MED ORDER — CYCLOBENZAPRINE HCL 10 MG PO TABS: 10.0000 mg | ORAL_TABLET | Freq: Two times a day (BID) | ORAL | Status: DC | PRN

## 2015-06-12 NOTE — ED Notes (Signed)
Pt from home with c/o right lower back injury at work yesterday.  Pt called the ambulance and then went to UC and the ED but did not stay due to wait time.  Pt reports she thinks she pulled something.  Ambulatory, NAD, A&O.

## 2015-06-12 NOTE — ED Provider Notes (Signed)
CSN: [id number]     Arrival date & time 06/12/15  0827 History   First MD Initiated Contact with Patient 06/12/15 [id number]     Chief Complaint  Patient presents with  . Back Pain     (Consider location/radiation/quality/duration/timing/severity/associated sxs/prior Treatment) Patient is a 35 y.o. female presenting with back pain. The history is provided by the patient.  Back Pain Location:  Lumbar spine (right side) Quality:  Aching Radiates to:  Does not radiate Pain severity:  Moderate Pain is:  Same all the time Onset quality:  Sudden Duration:  1 day Timing:  Constant Progression:  Unchanged Chronicity:  New Context: lifting heavy objects, occupational injury and twisting   Relieved by:  Nothing Worsened by:  Bending, ambulation, twisting and movement Ineffective treatments:  OTC medications Associated symptoms: no abdominal pain, no abdominal swelling, no bladder incontinence, no bowel incontinence, no dysuria, no fever, no leg pain, no numbness, no paresthesias, no pelvic pain, no perianal numbness, no tingling and no weakness    Patient presents with right moderate, constant, nonradiating, aching lower back pain 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 Denies fever, bowel or bladder incontinence, abdominal pain, or urinary symptoms. She has been taking Tylenol with minimal relief. Aggravating factors include movement, twisting, or ambulating. Patient is able to ambulate, but with pain.  Past Medical History  Diagnosis Date  . History of bladder infections   . Abnormal Pap smear   . Trichomonas   . Diabetes mellitus in pregnancy (HCC) 2009  . Depression     no meds, seeing counselor  . Shortness of breath     pregnancy related   Past Surgical History  Procedure Laterality Date  . Cesarean section  2013    with BTL   Family History  Problem Relation Age of Onset  . Hypertension Mother   . Diabetes Sister    Social History  Substance Use Topics  . Smoking status:  Current Every Day Smoker -- 0.20 packs/day    Types: Cigarettes    Last Attempt to Quit: 01/14/2011  . Smokeless tobacco: Never Used  . Alcohol Use: No   OB History    Gravida Para Term Preterm AB TAB SAB Ectopic Multiple Living   0 0 0 3     Review of Systems  Constitutional: Negative for fever.  Gastrointestinal: Negative for abdominal pain and bowel incontinence.  Genitourinary: Negative for bladder incontinence, dysuria and pelvic pain.  Musculoskeletal: Positive for back pain.  Neurological: Negative for tingling, weakness, numbness and paresthesias.  All other systems reviewed and are negative.     Allergies  Review of patient's allergies indicates no known allergies.  Home Medications   Prior to Admission medications   Medication Sig Start Date End Date Taking? Authorizing Provider  acyclovir (ZOVIRAX) 400 MG tablet Take 1 tablet (400 mg total) by mouth 3 (three) times daily. 05/20/15   Armando Reichert, CNM  cyclobenzaprine (FLEXERIL) 10 MG tablet Take 1 tablet (10 mg total) by mouth 2 (two) times daily as needed for muscle spasms. 06/12/15   Cheri Fowler, PA-C  ibuprofen (ADVIL,MOTRIN) 800 MG tablet Take 1 tablet (800 mg total) by mouth 3 (three) times daily. 06/12/15   Jaylean Buenaventura, PA-C   BP 134/85 mmHg  Pulse 81  Temp(Src)960454098F (36.9 C) (Oral)  Resp 20  SpO2 100%  LMP 05/23/2015 Physical Exam  Constitutional: She is oriented to person, place, and time. She appears well-developed and  well-nourished.  HENT:  Head: Atraumatic.  Eyes: Conjunctivae are normal.  Cardiovascular: Intact distal pulses.   Pulses:      Dorsalis pedis pulses are 2+ on the right side, and 2+ on the left side.  Pulmonary/Chest: Effort normal.  Abdominal: Soft. She exhibits no distension. There is no tenderness.  Musculoskeletal: She exhibits tenderness.  No spinous process tenderness.  No step offs. No crepitus. Right lumbar musculature region TTP.  No spasm noted.    Neurological: She is alert and oriented to person, place, and time.  No saddle anesthesia. Strength and sensation intact bilaterally throughout lower extremities.  Gait slowed, but normal.   Skin: Skin is warm and dry.  Psychiatric: She has a normal mood and affect. Her behavior is normal.    ED Course  Procedures (including critical care time) Labs Review Labs Reviewed - No data to display  Imaging Review No results found. I have personally reviewed and evaluated these images and lab results as part of my medical decision-making.   EKG Interpretation None      MDM   Final diagnoses:  Right-sided low back pain without sciatica  Muscle strain    Patient presents with low back pain.  VSS.  No injury/trauma.  No red flags.  No neurological deficits.  Distal pulses intact.  No gait abnormalities.  Suspect low back strain or other mechanical cause.  Doubt cauda equina.  Doubt infectious process.  Doubt AAA.  Discussed return precautions to the ED.  Follow up with PCP.     Cheri Fowler, PA-C 06/12/15 1610  Blane Ohara, MD 06/12/15 1537

## 2015-06-12 NOTE — Discharge Instructions (Signed)
Back Injury Prevention °Back injuries can be very painful. They can also be difficult to heal. After having one back injury, you are more likely to injure your back again. It is important to learn how to avoid injuring or re-injuring your back. The following tips can help you to prevent a back injury. °WHAT SHOULD I KNOW ABOUT PHYSICAL FITNESS? °· Exercise for 30 minutes per day on most days of the week or as directed by your health care provider. Make sure to: °· Do aerobic exercises, such as walking, jogging, biking, or swimming. °· Do exercises that increase balance and strength, such as tai chi and yoga. These can decrease your risk of falling and injuring your back. °· Do stretching exercises to help with flexibility. °· Try to develop strong abdominal muscles. Your abdominal muscles provide a lot of the support that is needed by your back. °· Maintain a healthy weight.  This helps to decrease your risk of a back injury. °WHAT SHOULD I KNOW ABOUT MY DIET? °· Talk with your health care provider about your overall diet. Take supplements and vitamins only as directed by your health care provider. °· Talk with your health care provider about how much calcium and vitamin D you need each day. These nutrients help to prevent weakening of the bones (osteoporosis). Osteoporosis can cause broken (fractured) bones, which lead to back pain. °· Include good sources of calcium in your diet, such as dairy products, green leafy vegetables, and products that have had calcium added to them (fortified). °· Include good sources of vitamin D in your diet, such as milk and foods that are fortified with vitamin D. °WHAT SHOULD I KNOW ABOUT MY POSTURE? °· Sit up straight and stand up straight. Avoid leaning forward when you sit or hunching over when you stand. °· Choose chairs that have good low-back (lumbar) support. °· If you work at a desk, sit close to it so you do not need to lean over. Keep your chin tucked in. Keep your neck  drawn back, and keep your elbows bent at a right angle. Your arms should look like the letter "L." °· Sit high and close to the steering wheel when you drive. Add a lumbar support to your car seat, if needed. °· Avoid sitting or standing in one position for very long. Take breaks to get up, stretch, and walk around at least one time every hour. Take breaks every hour if you are driving for long periods of time. °· Sleep on your side with your knees slightly bent, or sleep on your back with a pillow under your knees. Do not lie on the front of your body to sleep. °WHAT SHOULD I KNOW ABOUT LIFTING, TWISTING, AND REACHING? °Lifting and Heavy Lifting °· Avoid heavy lifting, especially repetitive heavy lifting. If you must do heavy lifting: °· Stretch before lifting. °· Work slowly. °· Rest between lifts. °· Use a tool such as a cart or a dolly to move objects if one is available. °· Make several small trips instead of carrying one heavy load. °· Ask for help when you need it, especially when moving big objects. °· Follow these steps when lifting: °· Stand with your feet shoulder-width apart. °· Get as close to the object as you can. Do not try to pick up a heavy object that is far from your body. °· Use handles or lifting straps if they are available. °· Bend at your knees. Squat down, but keep your heels off the floor. °·   heavy object that is far from your body.   Use handles or lifting straps if they are available.   Bend at your knees. Squat down, but keep your heels off the floor.   Keep your shoulders pulled back, your chin tucked in, and your back straight.   Lift the object slowly while you tighten the muscles in your legs, abdomen, and buttocks. Keep the object as close to the center of your body as possible.   Follow these steps when putting down a heavy load:   Stand with your feet shoulder-width apart.   Lower the object slowly while you tighten the muscles in your legs, abdomen, and buttocks. Keep the object as close to the center of your body as possible.   Keep your shoulders pulled back, your chin tucked in, and your back straight.   Bend at your knees. Squat  down, but keep your heels off the floor.   Use handles or lifting straps if they are available.  Twisting and Reaching   Avoid lifting heavy objects above your waist.   Do not twist at your waist while you are lifting or carrying a load. If you need to turn, move your feet.   Do not bend over without bending at your knees.   Avoid reaching over your head, across a table, or for an object on a high surface.  WHAT ARE SOME OTHER TIPS?   Avoid wet floors and icy ground. Keep sidewalks clear of ice to prevent falls.   Do not sleep on a mattress that is too soft or too hard.   Keep items that are used frequently within easy reach.   Put heavier objects on shelves at waist level, and put lighter objects on lower or higher shelves.   Find ways to decrease your stress, such as exercise, massage, or relaxation techniques. Stress can build up in your muscles. Tense muscles are more vulnerable to injury.   Talk with your health care provider if you feel anxious or depressed. These conditions can make back pain worse.   Wear flat heel shoes with cushioned soles.   Avoid sudden movements.   Use both shoulder straps when carrying a backpack.   Do not use any tobacco products, including cigarettes, chewing tobacco, or electronic cigarettes. If you need help quitting, ask your health care provider.    This information is not intended to replace advice given to you by your health care provider. Make sure you discuss any questions you have with your health care provider.    Document Released: 05/10/2004 Document Revised: 08/17/2014 Document Reviewed: 04/06/2014  Elsevier Interactive Patient Education 2016 Else[employer]        Back Pain, Adult  Back pain is very common in adults.The cause of back pain is rarely dangerous and the pain often gets better over time.The cause of your back pain may not be known. Some common causes of back pain include:   Strain of the muscles or ligaments supporting the spine.   Wear  and tear (degeneration) of the spinal disks.   Arthritis.        Direct injury to the back.  For many people, back pain may return. Since back pain is rarely dangerous, most people can learn to manage this condition on their own.  HOME CARE INSTRUCTIONS  Watch your back pain for any changes. The following actions may help to lessen any discomfort you are feeling:   Remain active. It is stressful on your back to   stay in bed.Resting more than 1-2 days can delay your recovery.  Pay attention to your body when you bend and lift. The most comfortable positions are those that put less stress on your recovering back. Always use proper lifting techniques, including:  Bending your knees.  Keeping the load close to your body.  Avoiding twisting.  Find a comfortable position to sleep. Use a firm mattress and lie on your side with your knees slightly bent. If you lie on your back, put a pillow under your knees.  Avoid feeling anxious or stressed.Stress increases muscle tension and can worsen back pain.It is important to recognize when you are anxious or stressed and learn ways to manage it, such as with exercise.  Take medicines only as directed by your health care provider. Over-the-counter medicines to reduce pain and inflammation are often the most helpful.Your health care provider may prescribe muscle relaxant drugs.These medicines help dull your pain so you can more quickly return to your normal activities and healthy exercise.  Apply ice to the injured area:  Put ice in a plastic  bag.  Place a towel between your skin and the bag.  Leave the ice on for 20 minutes, 2-3 times a day for the first 2-3 days. After that, ice and heat may be alternated to reduce pain and spasms.  Maintain a healthy weight. Excess weight puts extra stress on your back and makes it difficult to maintain good posture. SEEK MEDICAL CARE IF:  You have pain that is not relieved with rest or medicine.  You have increasing pain going down into the legs or buttocks.  You have pain that does not improve in one week.  You have night pain.  You lose weight.  You have a fever or chills. SEEK IMMEDIATE MEDICAL CARE IF:   You develop new bowel or bladder control problems.  You have unusual weakness or numbness in your arms or legs.  You develop nausea or vomiting.  You develop abdominal pain.  You feel faint.   This information is not intended to replace advice given to you by your health care provider. Make sure you discuss any questions you have with your health care provider.   Document Released: 04/02/2005 Document Revised: 04/23/2014 Document Reviewed: 08/04/2013 Elsevier Interactive Patient Education [employer].

## 2015-06-12 NOTE — ED Notes (Signed)
Declined W/C at D/C and was escorted to lobby by RN. 

## 2015-08-14 ENCOUNTER — Encounter (HOSPITAL_COMMUNITY): Payer: Self-pay | Admitting: *Deleted

## 2015-08-14 ENCOUNTER — Emergency Department (HOSPITAL_COMMUNITY)
Admission: EM | Admit: 2015-08-14 | Discharge: 2015-08-15 | Disposition: A | Discharge status: Home / Self Care | Payer: Medicaid Other | Attending: Emergency Medicine | Admitting: Emergency Medicine

## 2015-08-14 DIAGNOSIS — Z79899 Other long term (current) drug therapy: Secondary | ICD-10-CM | POA: Diagnosis not present

## 2015-08-14 DIAGNOSIS — F17210 Nicotine dependence, cigarettes, uncomplicated: Secondary | ICD-10-CM | POA: Diagnosis not present

## 2015-08-14 DIAGNOSIS — K0889 Other specified disorders of teeth and supporting structures: Secondary | ICD-10-CM | POA: Insufficient documentation

## 2015-08-14 DIAGNOSIS — Z8659 Personal history of other mental and behavioral disorders: Secondary | ICD-10-CM | POA: Diagnosis not present

## 2015-08-14 DIAGNOSIS — Z8619 Personal history of other infectious and parasitic diseases: Secondary | ICD-10-CM | POA: Insufficient documentation

## 2015-08-14 DIAGNOSIS — Z791 Long term (current) use of non-steroidal anti-inflammatories (NSAID): Secondary | ICD-10-CM | POA: Diagnosis not present

## 2015-08-14 DIAGNOSIS — K029 Dental caries, unspecified: Secondary | ICD-10-CM | POA: Insufficient documentation

## 2015-08-14 NOTE — ED Notes (Signed)
Patient presents stating she has been having dental pain (on the left side top and bottom)  Appt is May 11th

## 2015-08-15 NOTE — ED Provider Notes (Signed)
CSN: [id number]     Arrival date & time 08/14/15  2332 History   First MD Initiated Contact with Patient 08/14/15 2349     Chief Complaint  Patient presents with  . Dental Pain     (Consider location/radiation/quality/duration/timing/severity/associated sxs/prior Treatment) HPI Comments: Colleen Russell is a 35 y.o. female reports to ED with complaint of dental pain. Pain started initially 1.5 weeks ago in the left upper and lower back molars. Pain has progressively worsened, 8-9/10 on pain scale, sharp in nature. She experiences discomfort with eating, drinking, or when cool air comes in contact with the tooth. She has tried ibuprofen, tylenol, and orojel with minimal relief of symptoms. Denies fever, chills, nightsweats, nausea, vomiting, or diarrhea. Denies oral swelling, difficulty swallowing, or breathing. She is scheduled for dental appointment May 11th.   Patient is a 35 y.o. female presenting with tooth pain. The history is provided by the patient.  Dental Pain Location:  Lower and upper Quality:  Sharp Severity:  Severe Duration:  2 weeks Progression:  Worsening Associated symptoms: no fever     Past Medical History  Diagnosis Date  . History of bladder infections   . Abnormal Pap smear   . Trichomonas   . Diabetes mellitus in pregnancy (HCC) 2009  . Depression     no meds, seeing counselor  . Shortness of breath     pregnancy related   Past Surgical History  Procedure Laterality Date  . Cesarean section  2013    with BTL   Family History  Problem Relation Age of Onset  . Hypertension Mother   . Diabetes Sister    Social History  Substance Use Topics  . Smoking status: Current Every Day Smoker -- 0.20 packs/day    Types: Cigarettes    Last Attempt to Quit: 01/14/2011  . Smokeless tobacco: Never Used  . Alcohol Use: No   OB History    Gravida Para Term Preterm AB TAB SAB Ectopic Multiple Living   0 0 0 3     Review of Systems   Constitutional: Negative for fever, chills and diaphoresis.  HENT: Positive for dental problem ( tooth 16 and 17). Negative for sore throat and trouble swallowing.   Respiratory: Negative for shortness of breath.   Cardiovascular: Negative for chest pain.  Gastrointestinal: Negative for nausea, vomiting and diarrhea.      Allergies  Review of patient's allergies indicates no known allergies.  Home Medications   Prior to Admission medications   Medication Sig Start Date End Date Taking? Authorizing Provider  acyclovir (ZOVIRAX) 400 MG tablet Take 1 tablet (400 mg total) by mouth 3 (three) times daily. 05/20/15   Armando Reichert, CNM  cyclobenzaprine (FLEXERIL) 10 MG tablet Take 1 tablet (10 mg total) by mouth 2 (two) times daily as needed for muscle spasms. 06/12/15   Cheri Fowler, PA-C  ibuprofen (ADVIL,MOTRIN) 800 MG tablet Take 1 tablet (800 mg total) by mouth 3 (three) times daily. 06/12/15   Kayla Rose, PA-C   BP 147/92 mmHg  Pulse 79  Temp(Src) 98 F (36.7 C) (Oral)  Resp 16  Ht  (1.626 m)  Wt 86.183 kg  BMI 32.60 kg/m2  SpO2 100%  LMP 07/12/2015 Physical Exam  Constitutional: She appears well-developed and well-nourished. No distress.  HENT:  Head: Normocephalic and atraumatic.  Mouth/Throat: Uvula is midline, oropharynx is clear and moist and mucous membranes are normal. She does not have dentures. No  oral lesions. No trismus in the jaw. Dental caries present. No dental abscesses, uvula swelling or lacerations. No oropharyngeal exudate, posterior oropharyngeal edema, posterior oropharyngeal erythema or tonsillar abscesses.  Eyes: Conjunctivae are normal. No scleral icterus.  Neck: Normal range of motion. Neck supple. No tracheal deviation present.  Pulmonary/Chest: Effort normal. No respiratory distress.  Lymphadenopathy:    She has no cervical adenopathy.  Neurological: She is alert.  Skin: Skin is warm and dry. She is not diaphoretic.  Psychiatric: She has a normal  mood and affect.    ED Course  Procedures (including critical care time) Labs Review Labs Reviewed - No data to display  Imaging Review No results found.    EKG Interpretation None      MDM   Final diagnoses:  Pain, dental   Considered dental abscess, oropharyngeal abscess, and dental infection. No trismus. No swelling of oral cavity. No dental abscesses. No fever chills or night sweats. Diffuse caries noted on exam. On further discussion with Dr. Effie ShyWentz, patient endorsed improvement in dental pain with ibuprofen. However, pain returns, patient did not continue to take ibuprofen for relief of symptoms, opting to come to the ED. Patient reassured and educated on dosing of ibuprofen. Patient voiced understanding and is agreeable.     Colleen KettleAshley Laurel Reia Viernes, PA-C 08/15/15 0038  Colleen KettleAshley Laurel Alegandra Sommers, PA-C 08/15/15 0050  Mancel BaleElliott Wentz, MD 08/15/15 [phone]

## 2015-08-15 NOTE — Discharge Instructions (Signed)
Take ibuprofen 400mg  every 4-6 hours as needed for pain relief. Return to ED if symptoms worsen, develop a fever, swelling in the oral cavity, or difficulty breathing or swallowing.

## 2015-08-15 NOTE — ED Provider Notes (Signed)
  Face-to-face evaluation   History:  She complains of teeth pain, left upper and lower for 2 weeks. He is using ibuprofen with relief, but the pain tends to return. She denies ear pain, sinus congestion, fever, chills or nausea.  Physical exam: No trismus. Teeth with scattered dental caries. No obvious large caries, or suspected impending dental extraction need.  Medical screening examination/treatment/procedure(s) were conducted as a shared visit with non-physician practitioner(s) and myself.  I personally evaluated the patient during the encounter  Mancel BaleElliott Clee Pandit, MD 08/15/15 [phone]

## 2015-09-13 ENCOUNTER — Encounter (HOSPITAL_COMMUNITY): Payer: Self-pay | Admitting: Emergency Medicine

## 2015-09-13 ENCOUNTER — Emergency Department (HOSPITAL_COMMUNITY)
Admission: EM | Admit: 2015-09-13 | Discharge: 2015-09-13 | Disposition: A | Discharge status: Home / Self Care | Payer: Medicaid Other | Attending: Emergency Medicine | Admitting: Emergency Medicine

## 2015-09-13 DIAGNOSIS — F17210 Nicotine dependence, cigarettes, uncomplicated: Secondary | ICD-10-CM | POA: Diagnosis not present

## 2015-09-13 DIAGNOSIS — K0889 Other specified disorders of teeth and supporting structures: Secondary | ICD-10-CM | POA: Diagnosis not present

## 2015-09-13 DIAGNOSIS — Z8659 Personal history of other mental and behavioral disorders: Secondary | ICD-10-CM | POA: Insufficient documentation

## 2015-09-13 DIAGNOSIS — Z87448 Personal history of other diseases of urinary system: Secondary | ICD-10-CM | POA: Diagnosis not present

## 2015-09-13 DIAGNOSIS — Z8619 Personal history of other infectious and parasitic diseases: Secondary | ICD-10-CM | POA: Diagnosis not present

## 2015-09-13 MED ORDER — NAPROXEN 500 MG PO TABS: 500.0000 mg | ORAL_TABLET | Freq: Two times a day (BID) | ORAL | Status: DC

## 2015-09-13 MED ORDER — PENICILLIN V POTASSIUM 500 MG PO TABS: 500.0000 mg | ORAL_TABLET | Freq: Three times a day (TID) | ORAL | Status: DC

## 2015-09-13 NOTE — Discharge Instructions (Signed)

## 2015-09-13 NOTE — ED Notes (Signed)
Pt. reports persistent left upper/lower dental pain for several weeks unrelieved by OTC pain medications .

## 2015-09-13 NOTE — ED Provider Notes (Signed)
CSN: [id number]     Arrival date & time 09/13/15  0259 History   First MD Initiated Contact with Patient 09/13/15 0308     Chief Complaint  Patient presents with  . Dental Pain     (Consider location/radiation/quality/duration/timing/severity/associated sxs/prior Treatment) HPI Comments: Patient returns to the emergency department after 08/14/15 visit with complaint of persistent pain in multiple teeth. She reports she made an appointment with a dentist but they did not accept her insurance. She denies facial swelling, fever, difficulty swallowing or dental injury.   Patient is a 35 y.o. female presenting with tooth pain. The history is provided by the patient. No language interpreter was used.  Dental Pain Associated symptoms: no facial swelling, no fever and no headaches     Past Medical History  Diagnosis Date  . History of bladder infections   . Abnormal Pap smear   . Trichomonas   . Diabetes mellitus in pregnancy (HCC) 2009  . Depression     no meds, seeing counselor  . Shortness of breath     pregnancy related   Past Surgical History  Procedure Laterality Date  . Cesarean section  2013    with BTL   Family History  Problem Relation Age of Onset  . Hypertension Mother   . Diabetes Sister    Social History  Substance Use Topics  . Smoking status: Current Every Day Smoker -- 0.20 packs/day    Types: Cigarettes    Last Attempt to Quit: 01/14/2011  . Smokeless tobacco: Never Used  . Alcohol Use: No   OB History    Gravida Para Term Preterm AB TAB SAB Ectopic Multiple Living   5 3 3  0 2 1 1  0 0 3     Review of Systems  Constitutional: Negative for fever and chills.  HENT: Positive for dental problem. Negative for facial swelling and trouble swallowing.   Gastrointestinal: Negative.  Negative for nausea.  Musculoskeletal: Negative.  Negative for neck stiffness.  Neurological: Negative.  Negative for headaches.      Allergies  Review of patient's allergies  indicates no known allergies.  Home Medications   Prior to Admission medications   Medication Sig Start Date End Date Taking? Authorizing Provider  cyclobenzaprine (FLEXERIL) 10 MG tablet Take 1 tablet (10 mg total) by mouth 2 (two) times daily as needed for muscle spasms. 06/12/15  Yes Kayla Rose, PA-C   BP 139/89 mmHg  Pulse 57  Temp(Src) 98.5 F (36.9 C) (Oral)  Resp 16  SpO2 100%  LMP 08/13/2015 (Approximate) Physical Exam  Constitutional: She is oriented to person, place, and time. She appears well-developed and well-nourished.  HENT:  Generally good dentition. No visualized apical abscess. Gingival tenderness adjacent to #'s 14, 15 and 18. No facial swelling. No cervical or submental adenopathy. Oropharynx is benign.   Neck: Normal range of motion. Neck supple.  Pulmonary/Chest: Effort normal.  Neurological: She is alert and oriented to person, place, and time.  Skin: Skin is warm and dry.    ED Course  Procedures (including critical care time) Labs Review Labs Reviewed - No data to display  Imaging Review No results found. I have personally reviewed and evaluated these images and lab results as part of my medical decision-making.   EKG Interpretation None      MDM   Final diagnoses:  None    1. Dental pain  Encouraged the patient seek outpatient dental care for definitive treatment.     Elpidio AnisShari Addelynn Batte, PA-C  09/13/15 0358  April Palumbo, MD 09/13/15 0400

## 2015-10-10 ENCOUNTER — Encounter (HOSPITAL_COMMUNITY): Payer: Self-pay

## 2015-10-10 ENCOUNTER — Ambulatory Visit (HOSPITAL_COMMUNITY)
Admission: EM | Admit: 2015-10-10 | Discharge: 2015-10-10 | Disposition: A | Discharge status: Home / Self Care | Payer: Medicaid Other | Attending: Family Medicine | Admitting: Family Medicine

## 2015-10-10 DIAGNOSIS — K029 Dental caries, unspecified: Secondary | ICD-10-CM

## 2015-10-10 DIAGNOSIS — K089 Disorder of teeth and supporting structures, unspecified: Secondary | ICD-10-CM

## 2015-10-10 DIAGNOSIS — G8929 Other chronic pain: Secondary | ICD-10-CM

## 2015-10-10 MED ORDER — HYDROCODONE-ACETAMINOPHEN 5-325 MG PO TABS: 1.0000 | ORAL_TABLET | ORAL | Status: DC | PRN

## 2015-10-10 NOTE — Discharge Instructions (Signed)
Dental Pain °Dental pain may be caused by many things, including: °· Tooth decay (cavities or caries). Cavities cause the nerve of your tooth to be open to air and hot or cold temperatures. This can cause pain or discomfort. °· Abscess or infection. A dental abscess is an area that is full of infected pus from a bacterial infection in the inner part of the tooth (pulp). It usually happens at the end of the tooth's root. °· Injury. °· An unknown reason (idiopathic). °Your pain may be mild or severe. It may only happen when: °· You are chewing. °· You are exposed to hot or cold temperature. °· You are eating or drinking sugary foods or beverages, such as: °¨ Soda. °¨ Candy. °Your pain may also be there all of the time. °HOME CARE °Watch your dental pain for any changes. Do these things to lessen your discomfort: °· Take medicines only as told by your dentist. °· If your dentist tells you to take an antibiotic medicine, finish all of it even if you start to feel better. °· Keep all follow-up visits as told by your dentist. This is important. °· Do not apply heat to the outside of your face. °· Rinse your mouth or gargle with salt water if told by your dentist. This helps with pain and swelling. °¨ You can make salt water by adding ¼ tsp of salt to 1 cup of warm water. °· Apply ice to the painful area of your face: °¨ Put ice in a plastic bag. °¨ Place a towel between your skin and the bag. °¨ Leave the ice on for 20 minutes, 2-3 times per day. °· Avoid foods or drinks that cause you pain, such as: °¨ Very hot or very cold foods or drinks. °¨ Sweet or sugary foods or drinks. °GET HELP IF: °· Your pain is not helped with medicines. °· Your symptoms are worse. °· You have new symptoms. °GET HELP RIGHT AWAY IF: °· You cannot open your mouth. °· You are having trouble breathing or swallowing. °· You have a fever. °· Your face, neck, or jaw is puffy (swollen). °  °This information is not intended to replace advice given to  you by your health care provider. Make sure you discuss any questions you have with your health care provider. °  °Document Released: 09/19/2007 Document Revised: 08/17/2014 Document Reviewed: 03/29/2014 °Elsevier Interactive Patient Education ©2016 [employer] ° °

## 2015-10-10 NOTE — ED Provider Notes (Signed)
CSN: [id number]     Arrival date & time 10/10/15  1438 History   First MD Initiated Contact with Patient 10/10/15 1530     Chief Complaint  Patient presents with  . Dental Pain   (Consider location/radiation/quality/duration/timing/severity/associated sxs/prior Treatment) HPI History obtained from patient: Location:  teeth Context/Duration: Ongoing pain for about 1 month, was seen in ER. on antibx. Due for appointment July 13. Continues to have pain.   Severity: 6 crying  Quality:like a really bad toothache Timing:    constant        Home Treatment: OTC meds, rx meds without relief.  Associated symptoms:  Difficulty chewing on the left side of mouth. Family History:htn    Past Medical History  Diagnosis Date  . History of bladder infections   . Abnormal Pap smear   . Trichomonas   . Diabetes mellitus in pregnancy (HCC) 2009  . Depression     no meds, seeing counselor  . Shortness of breath     pregnancy related   Past Surgical History  Procedure Laterality Date  . Cesarean section  2013    with BTL   Family History  Problem Relation Age of Onset  . Hypertension Mother   . Diabetes Sister    Social History  Substance Use Topics  . Smoking status: Current Every Day Smoker -- 0.20 packs/day    Types: Cigarettes    Last Attempt to Quit: 01/14/2011  . Smokeless tobacco: Never Used  . Alcohol Use: No   OB History    Gravida Para Term Preterm AB TAB SAB Ectopic Multiple Living   5 3 3  0 2 1 1  0 0 3     Review of Systems  Denies: HEADACHE, NAUSEA, ABDOMINAL PAIN, CHEST PAIN, CONGESTION, DYSURIA, SHORTNESS OF BREATH  Allergies  Review of patient's allergies indicates no known allergies.  Home Medications   Prior to Admission medications   Medication Sig Start Date End Date Taking? Authorizing Provider  cyclobenzaprine (FLEXERIL) 10 MG tablet Take 1 tablet (10 mg total) by mouth 2 (two) times daily as needed for muscle spasms. 06/12/15  Yes Cheri FowlerKayla Rose, PA-C   HYDROcodone-acetaminophen (NORCO/VICODIN) 5-325 MG tablet Take 1 tablet by mouth every 4 (four) hours as needed. 10/10/15   Tharon AquasFrank C Dannah Ryles, PA  naproxen (NAPROSYN) 500 MG tablet Take 1 tablet (500 mg total) by mouth 2 (two) times daily. 09/13/15   Elpidio AnisShari Upstill, PA-C  penicillin v potassium (VEETID) 500 MG tablet Take 1 tablet (500 mg total) by mouth 3 (three) times daily. 09/13/15   Elpidio AnisShari Upstill, PA-C   Meds Ordered and Administered this Visit  Medications - No data to display  BP 141/85 mmHg  Pulse 91  Temp(Src) 98.3 F (36.8 C) (Oral)  Resp 19  SpO2 99%  LMP 09/27/2015 (Exact Date) No data found.   Physical Exam NURSES NOTES AND VITAL SIGNS REVIEWED. CONSTITUTIONAL: Well developed, well nourished, no acute distress HEENT: normocephalic, atraumatic, left upper teeth, posterior molars with caries. Teeth generally in good repair. No palpable abscess.  EYES: Conjunctiva normal NECK:normal ROM, supple, no adenopathy PULMONARY:No respiratory distress, normal effort ABDOMINAL: Soft, ND, NT BS+, No CVAT MUSCULOSKELETAL: Normal ROM of all extremities,  SKIN: warm and dry without rash PSYCHIATRIC: Mood and affect, behavior are normal  ED Course  Procedures (including critical care time)  Labs Review Labs Reviewed - No data to display  Imaging Review No results found.   Visual Acuity Review  Right Eye Distance:   Left Eye  Distance:   Bilateral Distance:    Right Eye Near:   Left Eye Near:    Bilateral Near:      Rx norco   MDM   1. Pain due to dental caries   2. Chronic dental pain     Patient is reassured that there are no issues that require transfer to higher level of care at this time or additional tests. Patient is advised to continue home symptomatic treatment. Patient is advised that if there are new or worsening symptoms to attend the emergency department, contact primary care provider, or return to UC. Instructions of care provided discharged home in  stable condition.    THIS NOTE WAS GENERATED USING A VOICE RECOGNITION SOFTWARE PROGRAM. ALL REASONABLE EFFORTS  WERE MADE TO PROOFREAD THIS DOCUMENT FOR ACCURACY.  I have verbally reviewed the discharge instructions with the patient. A printed AVS was given to the patient.  All questions were answered prior to discharge.      Tharon AquasFrank C Azarel Banner, PA 10/10/15 1649

## 2015-10-10 NOTE — ED Notes (Signed)
Patient presents with dental pain x1 month she has been given amoxicillin and Tylenol #3 but pt states medication is not working and she has a dental appt on 10/27/2015, pain has increased No acute distress

## 2015-12-27 ENCOUNTER — Ambulatory Visit (INDEPENDENT_AMBULATORY_CARE_PROVIDER_SITE_OTHER): Payer: Medicaid Other

## 2015-12-27 ENCOUNTER — Encounter (HOSPITAL_COMMUNITY): Payer: Self-pay | Admitting: Emergency Medicine

## 2015-12-27 ENCOUNTER — Ambulatory Visit (HOSPITAL_COMMUNITY)
Admission: EM | Admit: 2015-12-27 | Discharge: 2015-12-27 | Disposition: A | Discharge status: Home / Self Care | Payer: Medicaid Other | Attending: Family Medicine | Admitting: Family Medicine

## 2015-12-27 DIAGNOSIS — J40 Bronchitis, not specified as acute or chronic: Secondary | ICD-10-CM

## 2015-12-27 MED ORDER — LEVOFLOXACIN 500 MG PO TABS: 500.0000 mg | ORAL_TABLET | Freq: Every day | ORAL | 0 refills | Status: DC

## 2015-12-27 MED ORDER — GUAIFENESIN-CODEINE 100-10 MG/5ML PO SYRP: 10.0000 mL | ORAL_SOLUTION | Freq: Four times a day (QID) | ORAL | 0 refills | Status: DC | PRN

## 2015-12-27 NOTE — Discharge Instructions (Signed)
Take all of medicine, drink lots of fluids, no more smoking, see your doctor if further problems  °

## 2015-12-27 NOTE — ED Provider Notes (Signed)
[facility]    CSN: [id number] Arrival date & time: 12/27/15  1225  First Provider Contact:  First MD Initiated Contact with Patient 12/27/15 1329        History   Chief Complaint No chief complaint on file.   HPI Colleen Russell is a 35 y.o. female.   The history is provided by the patient.  Cough  Cough characteristics:  Productive Sputum characteristics:  Yellow Severity:  Moderate Onset quality:  Gradual Duration:  3 days Chronicity:  Recurrent Smoker: yes   Context: smoke exposure   Relieved by:  None tried Worsened by:  Nothing Ineffective treatments:  None tried Associated symptoms: rhinorrhea and sinus congestion   Associated symptoms: no fever and no shortness of breath     Past Medical History:  Diagnosis Date  . Abnormal Pap smear   . Depression    no meds, seeing counselor  . Diabetes mellitus in pregnancy (HCC) 2009  . History of bladder infections   . Shortness of breath    pregnancy related  . Trichomonas     There are no active problems to display for this patient.   Past Surgical History:  Procedure Laterality Date  . CESAREAN SECTION  2013   with BTL    OB History    Gravida Para Term Preterm AB Living   5 3 3  0 2 3   SAB TAB Ectopic Multiple Live Births   1 1 0 0 1       Home Medications    Prior to Admission medications   Medication Sig Start Date End Date Taking? Authorizing Provider  cyclobenzaprine (FLEXERIL) 10 MG tablet Take 1 tablet (10 mg total) by mouth 2 (two) times daily as needed for muscle spasms. 06/12/15   Cheri Fowler, PA-C  HYDROcodone-acetaminophen (NORCO/VICODIN) 5-325 MG tablet Take 1 tablet by mouth every 4 (four) hours as needed. 10/10/15   Tharon Aquas, PA  naproxen (NAPROSYN) 500 MG tablet Take 1 tablet (500 mg total) by mouth 2 (two) times daily. 09/13/15   Elpidio Anis, PA-C  penicillin v potassium (VEETID) 500 MG tablet Take 1 tablet (500 mg total) by mouth 3 (three) times daily.  09/13/15   Elpidio Anis, PA-C    Family History Family History  Problem Relation Age of Onset  . Hypertension Mother   . Diabetes Sister     Social History Social History  Substance Use Topics  . Smoking status: Current Every Day Smoker    Packs/day: 0.20    Types: Cigarettes    Last attempt to quit: 01/14/2011  . Smokeless tobacco: Never Used  . Alcohol use No     Allergies   Review of patient's allergies indicates no known allergies.   Review of Systems Review of Systems  Constitutional: Negative.  Negative for fever.  HENT: Positive for congestion, postnasal drip and rhinorrhea.   Respiratory: Positive for cough. Negative for shortness of breath.   Cardiovascular: Negative.   All other systems reviewed and are negative.    Physical Exam Triage Vital Signs ED Triage Vitals [12/27/15 1327]  Enc Vitals Group     BP 108/68     Pulse Rate 75     Resp 12     Temp 98.4 F (36.9 C)     Temp Source Oral     SpO2 100 %     Weight      Height      Head Circumference  Peak Flow      Pain Score      Pain Loc      Pain Edu?      Excl. in GC?    No data found.   Updated Vital Signs BP 108/68 (BP Location: Left Arm)   Pulse 75   Temp 98.4 F (36.9 C) (Oral)   Resp 12   SpO2 100%   Visual Acuity Right Eye Distance:   Left Eye Distance:   Bilateral Distance:    Right Eye Near:   Left Eye Near:    Bilateral Near:     Physical Exam  Constitutional: She is oriented to person, place, and time. She appears well-developed and well-nourished. No distress.  HENT:  Right Ear: External ear normal.  Left Ear: External ear normal.  Mouth/Throat: Oropharynx is clear and moist.  Eyes: EOM are normal. Pupils are equal, round, and reactive to light.  Neck: Normal range of motion. Neck supple.  Cardiovascular: Normal rate, regular rhythm and normal heart sounds.   Pulmonary/Chest: Effort normal and breath sounds normal.  Lymphadenopathy:    She has no  cervical adenopathy.  Neurological: She is alert and oriented to person, place, and time.  Skin: Skin is warm and dry.  Nursing note and vitals reviewed.    UC Treatments / Results  Labs (all labs ordered are listed, but only abnormal results are displayed) Labs Reviewed - No data to display  EKG  EKG Interpretation None       Radiology No results found. X-rays reviewed and report per radiologist.  Procedures Procedures (including critical care time)  Medications Ordered in UC Medications - No data to display   Initial Impression / Assessment and Plan / UC Course  I have reviewed the triage vital signs and the nursing notes.  Pertinent labs & imaging results that were available during my care of the patient were reviewed by me and considered in my medical decision making (see chart for details).  Clinical Course      Final Clinical Impressions(s) / UC Diagnoses   Final diagnoses:  None    New Prescriptions New Prescriptions   No medications on file     Linna HoffJames D Kamoni Gentles, MD 12/27/15 1413

## 2015-12-27 NOTE — ED Triage Notes (Signed)
Patient complains of bad cold. Onset yesterday of symptoms.  Patient complains of cough, coughing up yellow phlegm.  Blood tinged phlegm.  Patient has had dizziness..  Patient complains of wheezing.    Patient reports a toothache on upper , left mouth.  Tooth pain is not new.

## 2016-05-09 ENCOUNTER — Ambulatory Visit (HOSPITAL_COMMUNITY)
Admission: EM | Admit: 2016-05-09 | Discharge: 2016-05-09 | Disposition: A | Discharge status: Home / Self Care | Payer: Self-pay | Attending: Emergency Medicine | Admitting: Emergency Medicine

## 2016-05-09 ENCOUNTER — Encounter (HOSPITAL_COMMUNITY): Payer: Self-pay | Admitting: Emergency Medicine

## 2016-05-09 DIAGNOSIS — K029 Dental caries, unspecified: Secondary | ICD-10-CM

## 2016-05-09 DIAGNOSIS — K0889 Other specified disorders of teeth and supporting structures: Secondary | ICD-10-CM

## 2016-05-09 MED ORDER — KETOROLAC TROMETHAMINE 60 MG/2ML IM SOLN
60.0000 mg | Freq: Once | INTRAMUSCULAR | Status: AC
  Administered 2016-05-09: 60 mg via INTRAMUSCULAR

## 2016-05-09 MED ORDER — KETOROLAC TROMETHAMINE 60 MG/2ML IM SOLN
INTRAMUSCULAR | Status: AC
  Filled 2016-05-09: qty 2

## 2016-05-09 MED ORDER — ACETAMINOPHEN 325 MG PO TABS
975.0000 mg | ORAL_TABLET | Freq: Once | ORAL | Status: AC
  Administered 2016-05-09: 975 mg via ORAL

## 2016-05-09 MED ORDER — ONDANSETRON 4 MG PO TBDP
ORAL_TABLET | ORAL | Status: AC
  Filled 2016-05-09: qty 1

## 2016-05-09 MED ORDER — ACETAMINOPHEN 325 MG PO TABS
ORAL_TABLET | ORAL | Status: AC
  Filled 2016-05-09: qty 3

## 2016-05-09 MED ORDER — ONDANSETRON 4 MG PO TBDP
4.0000 mg | ORAL_TABLET | Freq: Once | ORAL | Status: AC
  Administered 2016-05-09: 4 mg via ORAL

## 2016-05-09 MED ORDER — IBUPROFEN 800 MG PO TABS: 800.0000 mg | ORAL_TABLET | Freq: Three times a day (TID) | ORAL | 0 refills | Status: DC

## 2016-05-09 NOTE — Discharge Instructions (Signed)
Please call a dentist ASAP and get the tooth looked at and treated by a Dentist.  Continue to take antibiotics from home.

## 2016-05-09 NOTE — ED Provider Notes (Signed)
CSN: [id number]     Arrival date & time 05/09/16  1657 History   First MD Initiated Contact with Patient 05/09/16 1748     Chief Complaint  Patient presents with  . Dental Pain   (Consider location/radiation/quality/duration/timing/severity/associated sxs/prior Treatment) Patient presents with c/o dental pain for 3 days.   The history is provided by the patient.  Dental Pain  Location:  Upper Upper teeth location:  15/LU 2nd molar Quality:  Aching and burning Severity:  Moderate Onset quality:  Sudden Duration:  3 days Timing:  Constant Chronicity:  New Context: dental caries   Relieved by:  None tried Worsened by:  Nothing Ineffective treatments:  None tried   Past Medical History:  Diagnosis Date  . Abnormal Pap smear   . Depression    no meds, seeing counselor  . Diabetes mellitus in pregnancy 2009  . History of bladder infections   . Shortness of breath    pregnancy related  . Trichomonas    Past Surgical History:  Procedure Laterality Date  . CESAREAN SECTION  2013   with BTL   Family History  Problem Relation Age of Onset  . Hypertension Mother   . Diabetes Sister    Social History  Substance Use Topics  . Smoking status: Current Every Day Smoker    Packs/day: 0.20    Types: Cigarettes    Last attempt to quit: 01/14/2011  . Smokeless tobacco: Never Used  . Alcohol use No   OB History    Gravida Para Term Preterm AB Living   5 3 3  0 2 3   SAB TAB Ectopic Multiple Live Births   1 1 0 0 1     Review of Systems  Constitutional: Negative.   HENT: Positive for dental problem.   Eyes: Negative.   Respiratory: Negative.   Cardiovascular: Negative.   Gastrointestinal: Negative.   Endocrine: Negative.   Genitourinary: Negative.   Musculoskeletal: Negative.   Allergic/Immunologic: Negative.   Neurological: Negative.   Hematological: Negative.   Psychiatric/Behavioral: Negative.     Allergies  Patient has no known allergies.  Home  Medications   Prior to Admission medications   Medication Sig Start Date End Date Taking? Authorizing Provider  ibuprofen (ADVIL,MOTRIN) 600 MG tablet Take 600 mg by mouth every 6 (six) hours as needed.   Yes Historical Provider, MD  ibuprofen (ADVIL,MOTRIN) 800 MG tablet Take 1 tablet (800 mg total) by mouth 3 (three) times daily. 05/09/16   Deatra CanterWilliam J Catelyn Friel, FNP   Meds Ordered and Administered this Visit   Medications  acetaminophen (TYLENOL) tablet 975 mg (975 mg Oral Given 05/09/16 1804)  ketorolac (TORADOL) injection 60 mg (60 mg Intramuscular Given 05/09/16 1803)  ondansetron (ZOFRAN-ODT) disintegrating tablet 4 mg (4 mg Oral Given 05/09/16 1804)    BP 145/92 (BP Location: Left Arm)   Pulse 83   Temp 99 F (37.2 C) (Oral)   Resp 18   SpO2 99%  No data found.   Physical Exam  Constitutional: She appears well-developed and well-nourished.  HENT:  Head: Normocephalic and atraumatic.  Right Ear: External ear normal.  Left Ear: External ear normal.  Mouth/Throat: Oropharynx is clear and moist.  Eyes: Conjunctivae and EOM are normal. Pupils are equal, round, and reactive to light.  Neck: Normal range of motion.  Cardiovascular: Normal rate, regular rhythm and normal heart sounds.   Pulmonary/Chest: Effort normal and breath sounds normal.  Abdominal: Soft. Bowel sounds are normal.  Nursing note and vitals  reviewed.   Urgent Care Course     Procedures (including critical care time)  Labs Review Labs Reviewed - No data to display  Imaging Review No results found.   Visual Acuity Review  Right Eye Distance:   Left Eye Distance:   Bilateral Distance:    Right Eye Near:   Left Eye Near:    Bilateral Near:         MDM   1. Dental caries   2. Pain, dental    Tylenol 975mg  po now Toradol 60mg  IM Zofran 4mg  now  Continue current abx's Motrin 800mg  po tid prn pain  Take tylenol otc as directed for pain  Follow up with Dentist.    Deatra Canter,  FNP 05/09/16 1814

## 2016-05-09 NOTE — ED Triage Notes (Signed)
The patient presented to the [facility] with a complaint of dental pain x 3 days. 

## 2016-07-07 ENCOUNTER — Ambulatory Visit (HOSPITAL_COMMUNITY)
Admission: EM | Admit: 2016-07-07 | Discharge: 2016-07-07 | Disposition: A | Discharge status: Home / Self Care | Payer: Self-pay | Attending: Internal Medicine | Admitting: Internal Medicine

## 2016-07-07 ENCOUNTER — Encounter (HOSPITAL_COMMUNITY): Payer: Self-pay | Admitting: *Deleted

## 2016-07-07 DIAGNOSIS — K0889 Other specified disorders of teeth and supporting structures: Secondary | ICD-10-CM

## 2016-07-07 MED ORDER — PENICILLIN V POTASSIUM 500 MG PO TABS: 500.0000 mg | ORAL_TABLET | Freq: Three times a day (TID) | ORAL | 0 refills | Status: AC

## 2016-07-07 MED ORDER — HYDROCODONE-ACETAMINOPHEN 5-325 MG PO TABS
1.0000 | ORAL_TABLET | Freq: Four times a day (QID) | ORAL | 0 refills | Status: AC | PRN
Start: 2016-07-07 — End: 2016-07-10

## 2016-07-07 NOTE — ED Triage Notes (Signed)
Pt  Reports   Pain  l  Upper  And  l  Side  X  4  Days  Has  Not  Seen  Dentist   yet

## 2016-07-07 NOTE — ED Provider Notes (Signed)
CSN: [id number]     Arrival date & time 07/07/16  1840 History   First MD Initiated Contact with Patient 07/07/16 1943     Chief Complaint  Patient presents with  . Dental Pain   (Consider location/radiation/quality/duration/timing/severity/associated sxs/prior Treatment) Does not currently have a dentist.    The history is provided by the patient.  Dental Pain  Location:  Upper and lower Upper teeth location:  15/LU 2nd molar Lower teeth location:  18/LL 2nd molar Quality:  Aching and dull Severity:  Severe Onset quality:  Gradual Duration:  2 months Timing:  Constant Progression:  Worsening Chronicity:  Recurrent Context: dental caries and poor dentition   Relieved by:  Nothing Ineffective treatments:  Acetaminophen, ice, NSAIDs and topical anesthetic gel Associated symptoms: no facial pain, no facial swelling, no fever, no gum swelling and no headaches     Past Medical History:  Diagnosis Date  . Abnormal Pap smear   . Depression    no meds, seeing counselor  . Diabetes mellitus in pregnancy 2009  . History of bladder infections   . Shortness of breath    pregnancy related  . Trichomonas    Past Surgical History:  Procedure Laterality Date  . CESAREAN SECTION  2013   with BTL   Family History  Problem Relation Age of Onset  . Hypertension Mother   . Diabetes Sister    Social History  Substance Use Topics  . Smoking status: Current Every Day Smoker    Packs/day: 0.20    Types: Cigarettes    Last attempt to quit: 01/14/2011  . Smokeless tobacco: Never Used  . Alcohol use No   OB History    Gravida Para Term Preterm AB Living   5 3 3  0 2 3   SAB TAB Ectopic Multiple Live Births   1 1 0 0 1     Review of Systems  Constitutional: Negative for fever.       As stated in the HPI  HENT: Negative for facial swelling.   Neurological: Negative for headaches.    Allergies  Patient has no known allergies.  Home Medications   Prior to Admission  medications   Medication Sig Start Date End Date Taking? Authorizing Provider  HYDROcodone-acetaminophen (NORCO/VICODIN) 5-325 MG tablet Take 1-2 tablets by mouth every 6 (six) hours as needed. 07/07/16 07/10/16  Lucia Estelle, NP  ibuprofen (ADVIL,MOTRIN) 600 MG tablet Take 600 mg by mouth every 6 (six) hours as needed.    Historical Provider, MD  ibuprofen (ADVIL,MOTRIN) 800 MG tablet Take 1 tablet (800 mg total) by mouth 3 (three) times daily. 05/09/16   Deatra Canter, FNP  penicillin v potassium (VEETID) 500 MG tablet Take 1 tablet (500 mg total) by mouth 3 (three) times daily. 07/07/16 07/14/16  Lucia Estelle, NP   Meds Ordered and Administered this Visit  Medications - No data to display  BP (!) 162/88 (BP Location: Left Arm)   Pulse 71   Temp 98.6 F (37 C) (Oral)   Resp 18   LMP 06/09/2016   SpO2 98%  No data found.   Physical Exam  Constitutional: She is oriented to person, place, and time. She appears well-developed and well-nourished.  HENT:  Head: Normocephalic and atraumatic.  Mouth/Throat:    Eyes: Conjunctivae are normal. Pupils are equal, round, and reactive to light.  Cardiovascular: Normal rate, regular rhythm and normal heart sounds.   Pulmonary/Chest: Effort normal and breath sounds normal. No respiratory distress. She  has no wheezes.  Neurological: She is alert and oriented to person, place, and time.  Skin: Skin is warm and dry. No rash noted.  Nursing note and vitals reviewed.   Urgent Care Course     Procedures (including critical care time)  Labs Review   MDM   1. Pain, dental    [place] controlled substances reviewed and looks appropriate. RX for hydrocodone-apap given. May supplement with ibuprofen at home. List of local [facility] dentist given.   Informed to see a dentist as soon as possible.     Lucia EstelleFeng Lamaj Metoyer, NP 07/07/16 [phone]

## 2016-07-09 ENCOUNTER — Emergency Department (HOSPITAL_COMMUNITY)
Admission: EM | Admit: 2016-07-09 | Discharge: 2016-07-09 | Disposition: A | Discharge status: Home / Self Care | Payer: Medicaid Other | Attending: Dermatology | Admitting: Dermatology

## 2016-07-09 ENCOUNTER — Encounter (HOSPITAL_COMMUNITY): Payer: Self-pay | Admitting: *Deleted

## 2016-07-09 DIAGNOSIS — F17210 Nicotine dependence, cigarettes, uncomplicated: Secondary | ICD-10-CM | POA: Insufficient documentation

## 2016-07-09 DIAGNOSIS — K0889 Other specified disorders of teeth and supporting structures: Secondary | ICD-10-CM | POA: Insufficient documentation

## 2016-07-09 NOTE — ED Triage Notes (Signed)
Pt sent here by her dentist for a referral to have a tooth pulled.

## 2016-07-09 NOTE — ED Notes (Signed)
Pt notified registration that she was leaving.  Pt had an appointment at 2 pm with a dentist per Steward DroneBrenda, RN.

## 2017-12-22 ENCOUNTER — Encounter (HOSPITAL_COMMUNITY): Payer: Self-pay | Admitting: Internal Medicine

## 2017-12-22 ENCOUNTER — Emergency Department (HOSPITAL_COMMUNITY): Payer: Medicaid Other

## 2017-12-22 ENCOUNTER — Inpatient Hospital Stay (HOSPITAL_COMMUNITY)
Admission: EM | Admit: 2017-12-22 | Discharge: 2017-12-24 | DRG: 872 | Disposition: A | Discharge status: Home / Self Care | Payer: Medicaid Other | Attending: Family Medicine | Admitting: Family Medicine

## 2017-12-22 DIAGNOSIS — F17210 Nicotine dependence, cigarettes, uncomplicated: Secondary | ICD-10-CM | POA: Diagnosis present

## 2017-12-22 DIAGNOSIS — N12 Tubulo-interstitial nephritis, not specified as acute or chronic: Secondary | ICD-10-CM

## 2017-12-22 DIAGNOSIS — R112 Nausea with vomiting, unspecified: Secondary | ICD-10-CM | POA: Diagnosis present

## 2017-12-22 DIAGNOSIS — N393 Stress incontinence (female) (male): Secondary | ICD-10-CM | POA: Diagnosis present

## 2017-12-22 DIAGNOSIS — A4159 Other Gram-negative sepsis: Principal | ICD-10-CM | POA: Diagnosis present

## 2017-12-22 DIAGNOSIS — Z87440 Personal history of urinary (tract) infections: Secondary | ICD-10-CM | POA: Diagnosis not present

## 2017-12-22 DIAGNOSIS — I10 Essential (primary) hypertension: Secondary | ICD-10-CM | POA: Diagnosis present

## 2017-12-22 DIAGNOSIS — E669 Obesity, unspecified: Secondary | ICD-10-CM | POA: Diagnosis present

## 2017-12-22 DIAGNOSIS — E1165 Type 2 diabetes mellitus with hyperglycemia: Secondary | ICD-10-CM | POA: Diagnosis present

## 2017-12-22 DIAGNOSIS — Z6835 Body mass index (BMI) 35.0-35.9, adult: Secondary | ICD-10-CM | POA: Diagnosis not present

## 2017-12-22 DIAGNOSIS — K5900 Constipation, unspecified: Secondary | ICD-10-CM | POA: Diagnosis present

## 2017-12-22 DIAGNOSIS — N10 Acute tubulo-interstitial nephritis: Secondary | ICD-10-CM | POA: Diagnosis present

## 2017-12-22 DIAGNOSIS — Z720 Tobacco use: Secondary | ICD-10-CM | POA: Diagnosis present

## 2017-12-22 DIAGNOSIS — A419 Sepsis, unspecified organism: Secondary | ICD-10-CM | POA: Diagnosis present

## 2017-12-22 LAB — COMPREHENSIVE METABOLIC PANEL
ALBUMIN: 3.7 g/dL (ref 3.5–5.0)
ALT: 17 U/L (ref 0–44)
ANION GAP: 12 (ref 5–15)
AST: 16 U/L (ref 15–41)
Alkaline Phosphatase: 62 U/L (ref 38–126)
BILIRUBIN TOTAL: 1.4 mg/dL — AB (ref 0.3–1.2)
BUN: 9 mg/dL (ref 6–20)
CO2: 21 mmol/L — ABNORMAL LOW (ref 22–32)
Calcium: 9.1 mg/dL (ref 8.9–10.3)
Chloride: 102 mmol/L (ref 98–111)
Creatinine, Ser: 1.01 mg/dL — ABNORMAL HIGH (ref 0.44–1.00)
GFR calc Af Amer: >60 mL/min (ref >60)
Glucose, Bld: 151 mg/dL — ABNORMAL HIGH (ref 70–99)
POTASSIUM: 3.5 mmol/L (ref 3.5–5.1)
Sodium: 135 mmol/L (ref 135–145)
TOTAL PROTEIN: 7.2 g/dL (ref 6.5–8.1)

## 2017-12-22 LAB — URINALYSIS, ROUTINE W REFLEX MICROSCOPIC
BILIRUBIN URINE: NEGATIVE
GLUCOSE, UA: NEGATIVE mg/dL
Ketones, ur: NEGATIVE mg/dL
NITRITE: NEGATIVE
PH: 6 (ref 5.0–8.0)
Protein, ur: 30 mg/dL — AB
SPECIFIC GRAVITY, URINE: 1.02 (ref 1.005–1.030)
Trans Epithel, UA: 2

## 2017-12-22 LAB — CBC WITH DIFFERENTIAL/PLATELET
ABS IMMATURE GRANULOCYTES: 0.1 10*3/uL (ref 0.0–0.1)
BASOS ABS: 0.1 10*3/uL (ref 0.0–0.1)
Basophils Relative: 0 %
Eosinophils Absolute: 0 10*3/uL (ref 0.0–0.7)
Eosinophils Relative: 0 %
HCT: 45.3 % (ref 36.0–46.0)
HEMOGLOBIN: 14.7 g/dL (ref 12.0–15.0)
IMMATURE GRANULOCYTES: 0 %
LYMPHS PCT: 13 %
Lymphs Abs: 2.1 10*3/uL (ref 0.7–4.0)
MCH: 31.6 pg (ref 26.0–34.0)
MCHC: 32.5 g/dL (ref 30.0–36.0)
MCV: 97.4 fL (ref 78.0–100.0)
Monocytes Absolute: 1 10*3/uL (ref 0.1–1.0)
Monocytes Relative: 6 %
NEUTROS ABS: 13.7 10*3/uL — AB (ref 1.7–7.7)
Neutrophils Relative %: 81 %
Platelets: 330 10*3/uL (ref 150–400)
RBC: 4.65 MIL/uL (ref 3.87–5.11)
RDW: 12.6 % (ref 11.5–15.5)
WBC: 16.9 10*3/uL — ABNORMAL HIGH (ref 4.0–10.5)

## 2017-12-22 LAB — HEMOGLOBIN A1C
Hgb A1c MFr Bld: 6 % — ABNORMAL HIGH (ref 4.8–5.6)
Mean Plasma Glucose: 125.5 mg/dL

## 2017-12-22 LAB — I-STAT BETA HCG BLOOD, ED (MC, WL, AP ONLY): I-stat hCG, quantitative: <5 m[IU]/mL (ref <5)

## 2017-12-22 LAB — I-STAT CG4 LACTIC ACID, ED
Lactic Acid, Venous: 1.41 mmol/L (ref 0.5–1.9)
Lactic Acid, Venous: 1.06 mmol/L (ref 0.5–1.9)

## 2017-12-22 MED ORDER — IBUPROFEN 600 MG PO TABS: 600.0000 mg | ORAL_TABLET | Freq: Four times a day (QID) | ORAL | Status: DC | PRN

## 2017-12-22 MED ORDER — ALBUTEROL SULFATE (2.5 MG/3ML) 0.083% IN NEBU: 2.5000 mg | INHALATION_SOLUTION | RESPIRATORY_TRACT | Status: DC | PRN

## 2017-12-22 MED ORDER — SODIUM CHLORIDE 0.9 % IV SOLN
1.0000 g | INTRAVENOUS | Status: DC
  Administered 2017-12-22 – 2017-12-23 (×2): 1 g via INTRAVENOUS
  Filled 2017-12-22 (×4): qty 10

## 2017-12-22 MED ORDER — MORPHINE SULFATE (PF) 4 MG/ML IV SOLN
4.0000 mg | Freq: Once | INTRAVENOUS | Status: AC
  Administered 2017-12-22: 4 mg via INTRAVENOUS
  Filled 2017-12-22: qty 1

## 2017-12-22 MED ORDER — ENOXAPARIN SODIUM 40 MG/0.4ML ~~LOC~~ SOLN
40.0000 mg | SUBCUTANEOUS | Status: DC
  Administered 2017-12-22 – 2017-12-23 (×2): 40 mg via SUBCUTANEOUS
  Filled 2017-12-22 (×2): qty 0.4

## 2017-12-22 MED ORDER — ACETAMINOPHEN 325 MG PO TABS
650.0000 mg | ORAL_TABLET | Freq: Four times a day (QID) | ORAL | Status: DC | PRN
  Administered 2017-12-22 – 2017-12-24 (×6): 650 mg via ORAL
  Filled 2017-12-22 (×6): qty 2

## 2017-12-22 MED ORDER — ONDANSETRON HCL 4 MG/2ML IJ SOLN
4.0000 mg | Freq: Four times a day (QID) | INTRAMUSCULAR | Status: DC | PRN
  Administered 2017-12-22: 4 mg via INTRAVENOUS
  Filled 2017-12-22: qty 2

## 2017-12-22 MED ORDER — SODIUM CHLORIDE 0.9 % IV SOLN
INTRAVENOUS | Status: AC
  Administered 2017-12-22 – 2017-12-23 (×3): via INTRAVENOUS

## 2017-12-22 MED ORDER — ACETAMINOPHEN 325 MG PO TABS
650.0000 mg | ORAL_TABLET | Freq: Once | ORAL | Status: AC | PRN
  Administered 2017-12-22: 650 mg via ORAL
  Filled 2017-12-22: qty 2

## 2017-12-22 MED ORDER — MORPHINE SULFATE (PF) 2 MG/ML IV SOLN: 1.0000 mg | Freq: Four times a day (QID) | INTRAVENOUS | Status: DC | PRN

## 2017-12-22 MED ORDER — ONDANSETRON HCL 4 MG PO TABS: 4.0000 mg | ORAL_TABLET | Freq: Four times a day (QID) | ORAL | Status: DC | PRN

## 2017-12-22 MED ORDER — FAMOTIDINE IN NACL 20-0.9 MG/50ML-% IV SOLN
20.0000 mg | INTRAVENOUS | Status: DC
  Administered 2017-12-22 – 2017-12-23 (×2): 20 mg via INTRAVENOUS
  Filled 2017-12-22 (×2): qty 50

## 2017-12-22 MED ORDER — ACETAMINOPHEN 650 MG RE SUPP: 650.0000 mg | Freq: Four times a day (QID) | RECTAL | Status: DC | PRN

## 2017-12-22 MED ORDER — SODIUM CHLORIDE 0.9 % IV BOLUS
1000.0000 mL | Freq: Once | INTRAVENOUS | Status: AC
  Administered 2017-12-22: 984 mL via INTRAVENOUS

## 2017-12-22 MED ORDER — SODIUM CHLORIDE 0.9 % IV BOLUS
1000.0000 mL | Freq: Once | INTRAVENOUS | Status: AC
  Administered 2017-12-22: 950 mL via INTRAVENOUS

## 2017-12-22 MED ORDER — ONDANSETRON HCL 4 MG/2ML IJ SOLN
4.0000 mg | Freq: Once | INTRAMUSCULAR | Status: AC
  Administered 2017-12-22: 4 mg via INTRAVENOUS
  Filled 2017-12-22: qty 2

## 2017-12-22 NOTE — ED Notes (Signed)
Pt returned from xray

## 2017-12-22 NOTE — ED Notes (Signed)
admittinbg doctor at the bedside 

## 2017-12-22 NOTE — ED Notes (Signed)
Report called to rn on 6 n 

## 2017-12-22 NOTE — ED Notes (Signed)
Pt c/o being hor  And she is sweating  She had tylenol earlier

## 2017-12-22 NOTE — ED Notes (Signed)
C/o being cold 

## 2017-12-22 NOTE — H&P (Signed)
History and Physical    Colleen Russell UTM:[id number] DOB: [birth year] DOA: 12/22/2017  PCP: Lady Gary, [facility]   I have briefly reviewed patients previous medical reports in [facility].  Patient coming from: Home  Chief Complaint: Dysuria, suprapubic pain, chills, nausea and vomiting.  HPI: Colleen Russell is a pleasant 37 year old female patient, lives with her fianc and 3 kids aged 17, 98 and 52, independent and works as a Quarry manager at [employer], [place] of pregnancy associated hypertension and diabetes, remote history of infrequent UTIs, ongoing tobacco use, presented to [facility] ED on 12/22/2017 with above complaints.  She was in her usual state of health until mid week last week when she noted dysuria, suprapubic pain, lower back/flank pain, chills.  She felt she had a UTI and took some OTC medication for same.  For the next 2 days her symptoms seem to improve.  However day prior to admission, she noted recurrence and worsening of same symptoms associated with 3 episodes of nonbloody emesis, decreased appetite and 2 episodes of self-limiting diarrhea.  She stayed home and in bed most of the day.  Today she noted further worsening of her symptoms associated with some headache, dizziness, generalized weakness and intermittent mild dry cough and hence presented to the ED.  She gives approximately a month history of urinary incontinence when she cannot control and urinates on herself before reaching the toilet.  No history of eating or drinking anything unusual or outside.  No history of sick contacts at home or at work with GI symptoms.  Sexually active with her fianc.  No history of vaginal discharge, pruritus or lesions.  ED Course: Patient presented to ED with temperature of 101.2, pulse rate of 134, blood pressure 124/91.  WBC 16.9, urine microscopy showed rare bacteria but significant pyuria >50.  Pregnancy test negative.  Chest x-ray showed stable minimal chronic  bronchitic changes but no acute abnormalities.  Urine and blood cultures were sent.  She was bolused with a liter of IV fluids and started empirically on IV ceftriaxone.  She feels significantly better at this time.  She has remained hemodynamically stable.  Lactate is normal.  Review of Systems:  All other systems reviewed and apart from HPI, are negative.  Past Medical History:  Diagnosis Date  . Abnormal Pap smear   . Depression    no meds, seeing counselor  . Diabetes mellitus in pregnancy 2009  . History of bladder infections   . Shortness of breath    pregnancy related  . Trichomonas     Past Surgical History:  Procedure Laterality Date  . CESAREAN SECTION  2013   with BTL    Social History  reports that she has been smoking cigarettes. She has been smoking about 0.20 packs per day. She has never used smokeless tobacco. She reports that she does not drink alcohol or use drugs.  No Known Allergies  Family History  Problem Relation Age of Onset  . Hypertension Mother   . Diabetes Sister      Prior to Admission medications   Medication Sig Start Date End Date Taking? Authorizing Provider  ibuprofen (ADVIL,MOTRIN) 200 MG tablet Take 200 mg by mouth every 6 (six) hours as needed for fever, headache, mild pain or moderate pain.   Yes [provider]    Physical Exam: Vitals:   12/22/17 1600 12/22/17 1630 12/22/17 1736 12/22/17 1758  BP: 129/84 126/83    Pulse: 96 82  Resp: 17 19    Temp:   98.9 F (37.2 C)   SpO2: 95% 97%    Weight:    93 kg    Patient was examined in the presence of her female ED RN in room as chaperone.  Constitutional: Pleasant young female, moderately built and obese, lying comfortably propped up in bed. Eyes: PERTLA, lids and conjunctivae normal ENMT: Mucous membranes are dry. Posterior pharynx clear of any exudate or lesions.  Some teeth with fillings. Neck: supple, no masses, no thyromegaly.  1 or two <0.5 cm mobile,  nontender lymph nodes in the submental and submandibular region. Respiratory: clear to auscultation bilaterally, no wheezing, no crackles. Normal respiratory effort. No accessory muscle use.  Cardiovascular: S1 & S2 heard, regular rate and rhythm, no murmurs / rubs / gallops. No extremity edema. 2+ pedal pulses. No carotid bruits.  Telemetry personally reviewed and shows sinus rhythm. Abdomen: Nondistended.  Mild suprapubic tenderness without rigidity, guarding or rebound.  No organomegaly or masses appreciated.  Right renal angle tenderness.  Normal bowel sounds heard. Musculoskeletal: no clubbing / cyanosis. No joint deformity upper and lower extremities. Good ROM, no contractures. Normal muscle tone.  Skin: no rashes, lesions, ulcers. No induration Neurologic: CN 2-12 grossly intact. Sensation intact, DTR normal. Strength 5/5 in all 4 limbs.  Psychiatric: Normal judgment and insight. Alert and oriented x 3. Normal mood.     Labs on Admission: I have personally reviewed following labs and imaging studies  CBC: Recent Labs  Lab 12/22/17 1409  WBC 16.9*  NEUTROABS 13.7*  HGB 14.7  HCT 45.3  MCV 97.4  PLT 740   Basic Metabolic Panel: Recent Labs  Lab 12/22/17 1409  NA 135  K 3.5  CL 102  CO2 21*  GLUCOSE 151*  BUN 9  CREATININE 1.01*  CALCIUM 9.1   Liver Function Tests: Recent Labs  Lab 12/22/17 1409  AST 16  ALT 17  ALKPHOS 62  BILITOT 1.4*  PROT 7.2  ALBUMIN 3.7   Urine analysis:    Component Value Date/Time   COLORURINE AMBER (A) 12/22/2017 1411   APPEARANCEUR HAZY (A) 12/22/2017 1411   LABSPEC 1.020 12/22/2017 1411   PHURINE 6.0 12/22/2017 1411   GLUCOSEU NEGATIVE 12/22/2017 1411   HGBUR SMALL (A) 12/22/2017 1411   BILIRUBINUR NEGATIVE 12/22/2017 1411   KETONESUR NEGATIVE 12/22/2017 1411   PROTEINUR 30 (A) 12/22/2017 1411   UROBILINOGEN 0.2 01/27/2014 0945   NITRITE NEGATIVE 12/22/2017 1411   LEUKOCYTESUR SMALL (A) 12/22/2017 1411     Radiological  Exams on Admission: Dg Chest Port 1 View  Result Date: 12/22/2017 CLINICAL DATA:  Nausea, vomiting, fever and diarrhea for the past 2 days. Smoker. EXAM: PORTABLE CHEST 1 VIEW COMPARISON:  12/16/2015. FINDINGS: Normal sized heart. Clear lungs. Stable minimal peribronchial thickening. Stable mild scoliosis. IMPRESSION: Stable minimal chronic bronchitic changes. No acute abnormality. Electronically Signed   By: Claudie Revering M.D.   On: 12/22/2017 15:25    EKG: Independently reviewed.  Sinus tachycardia at 103 bpm, normal axis, no acute changes and QTC 431 ms.  Assessment/Plan Principal Problem:   Sepsis ([place]) Active Problems:   Acute pyelonephritis   Tobacco abuse   Nausea and vomiting     1. Sepsis, likely related to acute pyelonephritis: As evidenced by dysuria, suprapubic and flank pain, fever and chills.  Met sepsis criteria on admission including fever, tachycardia, elevated blood pressure and leukocytosis along with urine microscopy suspicious for UTI.  Continue IV fluids and empirically  started IV ceftriaxone pending urine culture and blood culture results.  Has remained hemodynamically stable.  Clinically improved.  Sepsis physiology have improved or even resolved. 2. Nausea and vomiting: May be related to problem #1 versus less likely due to acute viral GE.  Treat as above.  Diet as tolerated.  Antiemetics and IV Pepcid. 3. Tobacco abuse: Cessation counseled. 4. Urinary incontinence: Unclear etiology.  Advised her that treating UTI may improve but not completely resolved symptoms.  She is advised to follow-up with her GYN/PCP and may consider outpatient Urology consultation. 5. Hyperglycemia: although mild/150, given h/o pregnancy related DM, will check A1C. 6. Obesity/Body mass index is 35.19 kg/m. Needs to lose weight.   DVT prophylaxis: Lovenox Code Status: Full Family Communication: None at bedside Disposition Plan: DC home pending clinical improvement Consults called:  None Admission status: Medical bed, inpatient.  Severity of Illness: The appropriate patient status for this patient is INPATIENT. Inpatient status is judged to be reasonable and necessary in order to provide the required intensity of service to ensure the patient's safety. The patient's presenting symptoms, physical exam findings, and initial radiographic and laboratory data in the context of their chronic comorbidities is felt to place them at high risk for further clinical deterioration. Furthermore, it is not anticipated that the patient will be medically stable for discharge from the hospital within 2 midnights of admission. The following factors support the patient status of inpatient.   " The patient's presenting symptoms include dysuria, suprapubic pain, flank pain, chills, fever, nausea, vomiting and diarrhea. " The worrisome physical exam findings include fever, tachycardia, elevated blood pressure, dry oral mucosa, right flank tenderness and suprapubic tenderness. " The initial radiographic and laboratory data are worrisome because of leukocytosis, urine microscopy suggestive of UTI. " The chronic co-morbidities include tobacco abuse   * I certify that at the point of admission it is my clinical judgment that the patient will require inpatient hospital care spanning beyond 2 midnights from the point of admission due to high intensity of service, high risk for further deterioration and high frequency of surveillance required.Vernell Leep MD [facility] Pager [phone]  If 7PM-7AM, please contact night-coverage [email or web address] Password [email or web address]  12/22/2017, 6:20 PM

## 2017-12-22 NOTE — ED Notes (Signed)
Pt up to br

## 2017-12-22 NOTE — ED Triage Notes (Signed)
Pt states 3 days of painful urination and lower abdominal/pelvic pain. Pt has tried OTC meds for relief. Pt also states nausea vomiting. Pt also has diarrhea.

## 2017-12-22 NOTE — ED Provider Notes (Signed)
[facility] EMERGENCY DEPARTMENT Provider Note   CSN: [id number] Arrival date & time: 12/22/17  1334     History   Chief Complaint Chief Complaint  Patient presents with  . Emesis  . Dysuria  . Diarrhea  . Abdominal Pain    HPI Colleen Russell is a 37 y.o. female.  HPI  37 year old female, denies any chronic medical conditions, takes no daily medicines.  Presents with approximately 3 or 4 days of progressive lower abdominal pain, lower back pain, dysuria and has not developed fevers with nausea vomiting and some diarrhea.  This is progressive, has become severe, she has not been able to eat or drink anything in several days.  She has tried to take some medication in the waiting room when she arrived for her fever but vomited up the Tylenol.  She denies any vaginal discharge, denies any recent travel, has not had a urinary infection in many many years.  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  Past Medical History:  Diagnosis Date  . Abnormal Pap smear   . Depression    no meds, seeing counselor  . Diabetes mellitus in pregnancy 2009  . History of bladder infections   . Shortness of breath    pregnancy related  . Trichomonas     There are no active problems to display for this patient.   Past Surgical History:  Procedure Laterality Date  . CESAREAN SECTION  2013   with BTL     OB History    Gravida  5   Para  3   Term  3   Preterm  0   AB  2   Living  3     SAB  1   TAB  1   Ectopic  0   Multiple  0   Live Births  1            Home Medications    Prior to Admission medications   Medication Sig Start Date End Date Taking? Authorizing Provider  ibuprofen (ADVIL,MOTRIN) 600 MG tablet Take 600 mg by mouth every 6 (six) hours as needed.    [provider]  ibuprofen (ADVIL,MOTRIN) 800 MG tablet Take 1 tablet (800 mg total) by mouth 3 (three) times daily.  05/09/16   Deatra Canter, FNP    Family History Family History  Problem Relation Age of Onset  . Hypertension Mother   . Diabetes Sister     Social History Social History   Tobacco Use  . Smoking status: Current Every Day Smoker    Packs/day: 0.20    Types: Cigarettes    Last attempt to quit: 01/14/2011    Years since quitting: 6.9  . Smokeless tobacco: Never Used  Substance Use Topics  . Alcohol use: No  . Drug use: No     Allergies   Patient has no known allergies.   Review of Systems Review of Systems  All other systems reviewed and are negative.    Physical Exam Updated Vital Signs BP (!) 124/91   Pulse (!) 134   Temp (!) 101.2 F (38.4 C)   Resp 18   LMP 12/03/2017   SpO2 98%   Physical Exam  Constitutional: She appears well-developed and well-nourished. No distress.  HENT:  Head: Normocephalic and atraumatic.  Mouth/Throat: No oropharyngeal exudate.  Mucous membranes are dehydrated appearing  Eyes: Pupils are equal, round, and reactive to light.  Conjunctivae and EOM are normal. Right eye exhibits no discharge. Left eye exhibits no discharge. No scleral icterus.  Neck: Normal range of motion. Neck supple. No JVD present. No thyromegaly present.  Cardiovascular: Regular rhythm, normal heart sounds and intact distal pulses. Exam reveals no gallop and no friction rub.  No murmur heard. Heart rate 110, pulses equal at the radial arteries, no edema  Pulmonary/Chest: Effort normal and breath sounds normal. No respiratory distress. She has no wheezes. She has no rales.  Abdominal: Soft. Bowel sounds are normal. She exhibits no distension and no mass. There is no tenderness.  Suprapubic tenderness to palpation, no guarding or peritoneal signs, right CVA tenderness present, left side not tender  Musculoskeletal: Normal range of motion. She exhibits no edema or tenderness.  Lymphadenopathy:    She has no cervical adenopathy.  Neurological: She is alert.  Coordination normal.  Skin: Skin is warm. No rash noted. She is diaphoretic. No erythema.  Diffusely diaphoretic  Psychiatric: She has a normal mood and affect. Her behavior is normal.  Nursing note and vitals reviewed.    ED Treatments / Results  Labs (all labs ordered are listed, but only abnormal results are displayed) Labs Reviewed  COMPREHENSIVE METABOLIC PANEL - Abnormal; Notable for the following components:      Result Value   CO2 21 (*)    Glucose, Bld 151 (*)    Creatinine, Ser 1.01 (*)    Total Bilirubin 1.4 (*)    All other components within normal limits  CBC WITH DIFFERENTIAL/PLATELET - Abnormal; Notable for the following components:   WBC 16.9 (*)    Neutro Abs 13.7 (*)    All other components within normal limits  URINALYSIS, ROUTINE W REFLEX MICROSCOPIC - Abnormal; Notable for the following components:   Color, Urine AMBER (*)    APPearance HAZY (*)    Hgb urine dipstick SMALL (*)    Protein, ur 30 (*)    Leukocytes, UA SMALL (*)    WBC, UA >50 (*)    Bacteria, UA RARE (*)    All other components within normal limits  CULTURE, BLOOD (ROUTINE X 2)  CULTURE, BLOOD (ROUTINE X 2)  URINE CULTURE  I-STAT CG4 LACTIC ACID, ED  I-STAT BETA HCG BLOOD, ED ([facility], [facility], [facility] ONLY)    EKG None  Radiology No results found.  Procedures .Critical Care Performed by: Eber Hong, MD Authorized by: Eber Hong, MD   Critical care provider statement:    Critical care time (minutes):  35   Critical care time was exclusive of:  Separately billable procedures and treating other patients and teaching time   Critical care was necessary to treat or prevent imminent or life-threatening deterioration of the following conditions:  Sepsis   Critical care was time spent personally by me on the following activities:  Blood draw for specimens, development of treatment plan with patient or surrogate, discussions with consultants, evaluation of patient's response to treatment,  examination of patient, obtaining history from patient or surrogate, ordering and performing treatments and interventions, ordering and review of laboratory studies, ordering and review of radiographic studies, pulse oximetry, re-evaluation of patient's condition and review of old charts   (including critical care time)  Medications Ordered in ED Medications  sodium chloride 0.9 % bolus 1,000 mL (has no administration in time range)  ondansetron (ZOFRAN) injection 4 mg (has no administration in time range)  morphine 4 MG/ML injection 4 mg (has no administration in time range)  cefTRIAXone (  ROCEPHIN) 1 g in sodium chloride 0.9 % 100 mL IVPB (has no administration in time range)  acetaminophen (TYLENOL) tablet 650 mg (650 mg Oral Given 12/22/17 1348)     Initial Impression / Assessment and Plan / ED Course  I have reviewed the triage vital signs and the nursing notes.  Pertinent labs & imaging results that were available during my care of the patient were reviewed by me and considered in my medical decision making (see chart for details).     37 year old female appears septic likely from a urinary source, urinalysis does reveal greater than 50 white blood cells per high-power field, bacteria present, this is consistent with dysuria flank pain and a presumptive diagnosis of pyelonephritis.  She will need IV fluids, she is dehydrated but thankfully has a lactic acid that is 1.4 and her blood pressure is 129/90.  This is severe sepsis but not likely to be septic shock at this point.  White blood cells are 16,000+, this is also consistent with a diagnosis of sepsis given her tachycardia and her fever.  The patient is still tachycardic after fluids but looking much better, she will be admitted to the hospitalist service, I appreciate their willingness to help.  Fluid bolus, antibiotics given, patient with diagnosis of sepsis but not septic shock.  Final Clinical Impressions(s) / ED Diagnoses    Final diagnoses:  Pyelonephritis  Sepsis, due to unspecified organism [facility])      Eber Hong, MD 12/22/17 [phone]

## 2017-12-23 ENCOUNTER — Other Ambulatory Visit: Payer: Self-pay

## 2017-12-23 LAB — CBC
HCT: 37.5 % (ref 36.0–46.0)
Hemoglobin: 12.5 g/dL (ref 12.0–15.0)
MCH: 32.1 pg (ref 26.0–34.0)
MCHC: 33.3 g/dL (ref 30.0–36.0)
MCV: 96.2 fL (ref 78.0–100.0)
Platelets: 282 10*3/uL (ref 150–400)
RBC: 3.9 MIL/uL (ref 3.87–5.11)
RDW: 12.4 % (ref 11.5–15.5)
WBC: 10.9 10*3/uL — ABNORMAL HIGH (ref 4.0–10.5)

## 2017-12-23 LAB — BASIC METABOLIC PANEL
Anion gap: 6 (ref 5–15)
BUN: 5 mg/dL — AB (ref 6–20)
CO2: 23 mmol/L (ref 22–32)
CREATININE: 0.86 mg/dL (ref 0.44–1.00)
Calcium: 8.1 mg/dL — ABNORMAL LOW (ref 8.9–10.3)
Chloride: 109 mmol/L (ref 98–111)
GFR calc Af Amer: >60 mL/min (ref >60)
GFR calc non Af Amer: >60 mL/min (ref >60)
GLUCOSE: 120 mg/dL — AB (ref 70–99)
POTASSIUM: 3.3 mmol/L — AB (ref 3.5–5.1)
Sodium: 138 mmol/L (ref 135–145)

## 2017-12-23 LAB — HIV ANTIBODY (ROUTINE TESTING W REFLEX): HIV Screen 4th Generation wRfx: NONREACTIVE

## 2017-12-23 MED ORDER — LIVING WELL WITH DIABETES BOOK
Freq: Once | Status: AC
  Administered 2017-12-23: 17:00:00
  Filled 2017-12-23: qty 1

## 2017-12-23 MED ORDER — BISACODYL 10 MG RE SUPP: 10.0000 mg | Freq: Once | RECTAL | Status: DC

## 2017-12-23 MED ORDER — POLYETHYLENE GLYCOL 3350 17 G PO PACK
17.0000 g | PACK | Freq: Two times a day (BID) | ORAL | Status: DC
  Filled 2017-12-23 (×2): qty 1

## 2017-12-23 MED ORDER — NICOTINE 21 MG/24HR TD PT24
21.0000 mg | MEDICATED_PATCH | Freq: Every day | TRANSDERMAL | Status: DC
  Administered 2017-12-23 – 2017-12-24 (×2): 21 mg via TRANSDERMAL
  Filled 2017-12-23: qty 1

## 2017-12-23 MED ORDER — POTASSIUM CHLORIDE CRYS ER 20 MEQ PO TBCR
40.0000 meq | EXTENDED_RELEASE_TABLET | Freq: Once | ORAL | Status: AC
  Administered 2017-12-23: 40 meq via ORAL
  Filled 2017-12-23: qty 2

## 2017-12-23 NOTE — Discharge Instructions (Signed)
Diabetes free class for education related to diet [facility]  Registration is required @[phone]  Dining room D 1st Monday of month 0900-1100 3rd Monday of month 1730-1930  Additional support through nutrition and diabetes management center [phone].

## 2017-12-23 NOTE — Plan of Care (Signed)
  Problem: Clinical Measurements: Goal: Ability to maintain clinical measurements within normal limits will improve Outcome: Progressing Goal: Will remain free from infection Outcome: Progressing Goal: Diagnostic test results will improve Outcome: Progressing   Problem: Activity: Goal: Risk for activity intolerance will decrease Outcome: Progressing   Problem: Nutrition: Goal: Adequate nutrition will be maintained Outcome: Progressing   Problem: Pain Managment: Goal: General experience of comfort will improve Outcome: Progressing   Problem: Urinary Elimination: Goal: Signs and symptoms of infection will decrease Outcome: Progressing

## 2017-12-23 NOTE — Progress Notes (Signed)
[facility]  PROGRESS NOTE  Colleen Russell DXA:[id number] DOB: 1981/02/01 DOA: 12/22/2017 PCP: Ginette Otto, [facility]   Brief HPI:   37 year old female with history of pregnancy associated hypertension, diabetes mellitus, and frequent UTIs came to ED with complaints of lower back/flank pain, chills, suprapubic pain, stress incontinence.  Patient was diagnosed with possible pyelonephritis and started on IV antibiotics.  Urine culture has been obtained.  Patient also complains of constipation for past 3 days.    Subjective   This morning patient feels better, though continues to have mild stress incontinence   Assessment/Plan:     1. Sepsis due to UTI-patient empirically started on ceftriaxone.  Urine culture has been obtained.  Follow the results.  Sepsis physiology has resolved  2. Nausea vomiting-improved, continue antibiotics, IV Pepcid  3. Constipation-this can also cause stress incontinence.  1 dose of the Dulcolax  suppository.  MiraLAX twice a day  4. Hyperglycemia/prediabetes-HbA1c 6.0.  Will get diabetes coordinator for counseling.     CBG: No results for input(s): GLUCAP in the last 168 hours.  CBC: Recent Labs  Lab 12/22/17 1409 12/23/17 0458  WBC 16.9* 10.9*  NEUTROABS 13.7*  --   HGB 14.7 12.5  HCT 45.3 37.5  MCV 97.4 96.2  PLT 330 282    Basic Metabolic Panel: Recent Labs  Lab 12/22/17 1409 12/23/17 0458  NA 135 138  K 3.5 3.3*  CL 102 109  CO2 21* 23  GLUCOSE 151* 120*  BUN 9 5*  CREATININE 1.01* 0.86  CALCIUM 9.1 8.1*     DVT prophylaxis: Lovenox  Code Status: Full code  Family Communication: No family at bedside  Disposition Plan: likely home when medically ready for discharge   Consultants:  None  Procedures:  None   Antibiotics:   Anti-infectives (From admission, onward)   Start     Dose/Rate Route Frequency Ordered Stop   12/22/17 1515  cefTRIAXone (ROCEPHIN) 1 g in sodium chloride 0.9 % 100  mL IVPB     1 g 200 mL/hr over 30 Minutes Intravenous Every 24 hours 12/22/17 1508         Objective   Vitals:   12/22/17 2012 12/22/17 2020 12/22/17 2337 12/23/17 0320  BP: (!) 121/28   129/74  Pulse: 99   (!) 102  Resp: 20   20  Temp: (!) 100.5 F (38.1 C)  99 F (37.2 C) 98.7 F (37.1 C)  TempSrc:    Oral  SpO2: 100%   99%  Weight:  93 kg    Height:  5\' 5"  (1.651 m)      Intake/Output Summary (Last 24 hours) at 12/23/2017 1343 Last data filed at 12/23/2017 1100 Gross per 24 hour  Intake 2590.92 ml  Output 1400 ml  Net 1190.92 ml   Filed Weights   12/22/17 1758 12/22/17 2020  Weight: 93 kg 93 kg     Physical Examination:    General: Appears in no acute distress  Cardiovascular: S1-S2, regular  Respiratory: Clear to auscultation bilaterally  Abdomen: Soft, positive right CVA tenderness, no organomegaly  Extremities: No edema of the lower extremities  Neurologic: Alert, oriented x3, no focal deficit noted     Data Reviewed: I have personally reviewed following labs and imaging studies   Recent Results (from the past 240 hour(s))  Urine culture     Status: Abnormal (Preliminary result)   Collection Time: 12/22/17  1:50 PM  Result Value Ref Range Status   Specimen Description  URINE, CLEAN CATCH  Final   Special Requests   Final    NONE Performed at [facility], [address], [place], [place] [address]    Culture >=100,000 COLONIES/mL GRAM NEGATIVE RODS (A)  Final   Report Status PENDING  Incomplete  Blood Culture (routine x 2)     Status: None (Preliminary result)   Collection Time: 12/22/17  3:21 PM  Result Value Ref Range Status   Specimen Description BLOOD RIGHT FOREARM  Final   Special Requests   Final    BOTTLES DRAWN AEROBIC AND ANAEROBIC Blood Culture adequate volume   Culture   Final    NO GROWTH < 24 HOURS Performed at [facility], [address] [place], [place] [address]    Report Status PENDING  Incomplete  Blood  Culture (routine x 2)     Status: None (Preliminary result)   Collection Time: 12/22/17  3:26 PM  Result Value Ref Range Status   Specimen Description BLOOD LEFT ANTECUBITAL  Final   Special Requests   Final    BOTTLES DRAWN AEROBIC AND ANAEROBIC Blood Culture results may not be optimal due to an inadequate volume of blood received in culture bottles   Culture   Final    NO GROWTH < 24 HOURS Performed at [facility], [address], [place], [place] [address]    Report Status PENDING  Incomplete     Liver Function Tests: Recent Labs  Lab 12/22/17 1409  AST 16  ALT 17  ALKPHOS 62  BILITOT 1.4*  PROT 7.2  ALBUMIN 3.7   No results for input(s): LIPASE, AMYLASE in the last 168 hours. No results for input(s): AMMONIA in the last 168 hours.  Cardiac Enzymes: No results for input(s): CKTOTAL, CKMB, CKMBINDEX, TROPONINI in the last 168 hours. BNP (last 3 results) No results for input(s): BNP in the last 8760 hours.  ProBNP (last 3 results) No results for input(s): PROBNP in the last 8760 hours.    Studies: Dg Chest Port 1 View  Result Date: 12/22/2017 CLINICAL DATA:  Nausea, vomiting, fever and diarrhea for the past 2 days. Smoker. EXAM: PORTABLE CHEST 1 VIEW COMPARISON:  12/16/2015. FINDINGS: Normal sized heart. Clear lungs. Stable minimal peribronchial thickening. Stable mild scoliosis. IMPRESSION: Stable minimal chronic bronchitic changes. No acute abnormality. Electronically Signed   By: Beckie Salts M.D.   On: 12/22/2017 15:25    Scheduled Meds: . bisacodyl  10 mg Rectal Once  . enoxaparin (LOVENOX) injection  40 mg Subcutaneous Q24H      Time spent: 25 min  Meredeth Ide   [facility] Pager [phone]. If 7PM-7AM, please contact night-coverage at [email or web address], Office  [phone]  password [email or web address]  12/23/2017, 1:43 PM  LOS: 1 day

## 2017-12-23 NOTE — Progress Notes (Signed)
Inpatient Diabetes Program Recommendations  AACE/ADA: New Consensus Statement on Inpatient Glycemic Control (2015)  Target Ranges:  Prepandial:   less than 140 mg/dL      Peak postprandial:   less than 180 mg/dL (1-2 hours)      Critically ill patients:  140 - 180 mg/dL   Lab Results  Component Value Date   HGBA1C 6.0 (H) 12/22/2017    Review of Glycemic Control Results for Colleen Russell, Colleen Russell (MRN [id number]) as of 12/23/2017 16:21  Ref. Range 12/22/2017 14:09 12/22/2017 14:19 12/22/2017 16:27 12/23/2017 04:58  Glucose Latest Ref Range: 70 - 99 mg/dL 791 (H)   505 (H)   Diabetes history: Prediabetes Outpatient Diabetes medications: none Current orders for Inpatient glycemic control: none  Inpatient Diabetes Program Recommendations:    Noted consult for inpatient diabetes.   Spoke with patient regarding prediabetes. Reviewed patient's current A1c of 6%. Explained what a A1c is and what it measures. Also reviewed goal A1c with patient, importance of good glucose control @ home, and blood sugar goals. Reviewed briefly the role of pancreas, need for insulin and comorbidites.   Patient drinking sugary beverages and states, "I will stop, I can drink water." Discussed the importance of choosing carbohydrates wisely and educated patient on how to find amounts on food labels. Patient motivated to make this change.   Per patient, she wants additional education for diabetes. Pt claims to have insurance, so will place outpatient referral in the event something has changed.  No further questions at this time.   Thanks, Lujean Rave, MSN, RNC-OB Diabetes Coordinator [phone] (8a-5p)

## 2017-12-24 ENCOUNTER — Encounter (HOSPITAL_COMMUNITY): Payer: Self-pay

## 2017-12-24 DIAGNOSIS — N10 Acute tubulo-interstitial nephritis: Secondary | ICD-10-CM

## 2017-12-24 DIAGNOSIS — A419 Sepsis, unspecified organism: Secondary | ICD-10-CM

## 2017-12-24 DIAGNOSIS — K5900 Constipation, unspecified: Secondary | ICD-10-CM

## 2017-12-24 DIAGNOSIS — R112 Nausea with vomiting, unspecified: Secondary | ICD-10-CM

## 2017-12-24 LAB — BASIC METABOLIC PANEL
ANION GAP: 8 (ref 5–15)
BUN: <5 mg/dL — ABNORMAL LOW (ref 6–20)
CHLORIDE: 108 mmol/L (ref 98–111)
CO2: 24 mmol/L (ref 22–32)
Calcium: 8.6 mg/dL — ABNORMAL LOW (ref 8.9–10.3)
Creatinine, Ser: 0.77 mg/dL (ref 0.44–1.00)
GFR calc Af Amer: >60 mL/min (ref >60)
Glucose, Bld: 114 mg/dL — ABNORMAL HIGH (ref 70–99)
POTASSIUM: 3.9 mmol/L (ref 3.5–5.1)
Sodium: 140 mmol/L (ref 135–145)

## 2017-12-24 LAB — CBC
HEMATOCRIT: 35.6 % — AB (ref 36.0–46.0)
Hemoglobin: 11.8 g/dL — ABNORMAL LOW (ref 12.0–15.0)
MCH: 31.7 pg (ref 26.0–34.0)
MCHC: 33.1 g/dL (ref 30.0–36.0)
MCV: 95.7 fL (ref 78.0–100.0)
Platelets: 282 10*3/uL (ref 150–400)
RBC: 3.72 MIL/uL — AB (ref 3.87–5.11)
RDW: 12.4 % (ref 11.5–15.5)
WBC: 7.4 10*3/uL (ref 4.0–10.5)

## 2017-12-24 LAB — URINE CULTURE: Culture: >=100000 — AB

## 2017-12-24 MED ORDER — CIPROFLOXACIN HCL 500 MG PO TABS: 500.0000 mg | ORAL_TABLET | Freq: Two times a day (BID) | ORAL | 0 refills | Status: AC

## 2017-12-24 NOTE — Care Management Note (Signed)
Case Management Note  Patient Details  Name: Colleen Russell MRN: [id number] Date of Birth: Feb 06, 1981  Subjective/Objective:                    Action/Plan:  Confirmed patient has no insurance. Provided and explained MATCH letter. Patient voiced understanding. Expected Discharge Date:  12/24/17               Expected Discharge Plan:  Home/Self Care  In-House Referral:  Financial Counselor  Discharge planning Services  CM Consult, Medication Assistance, MATCH Program  Post Acute Care Choice:  NA Choice offered to:  Patient  DME Arranged:  N/A DME Agency:  NA  HH Arranged:  NA HH Agency:  NA  Status of Service:  Completed, signed off  If discussed at Long Length of Stay Meetings, dates discussed:    Additional Comments:  Kingsley Plan, RN 12/24/2017, 1:53 PM

## 2017-12-24 NOTE — Discharge Summary (Signed)
Physician Discharge Summary  Colleen Russell MGN:[id number] DOB: 05-07-1980 DOA: 12/22/2017  PCP: Ginette Otto, [facility]  Admit date: 12/22/2017 Discharge date: 12/24/2017  Time spent: 25* minutes  Recommendations for Outpatient Follow-up:  1. Follow up PCP in 2 weeks   Discharge Diagnoses:  Principal Problem:   Sepsis (HCC) Active Problems:   Acute pyelonephritis   Tobacco abuse   Nausea and vomiting   Discharge Condition: Stable  Diet recommendation: Regular diet  Filed Weights   12/22/17 1758 12/22/17 2020  Weight: 93 kg 93 kg    History of present illness:  37 year old female with history of pregnancy associated hypertension, diabetes mellitus, and frequent UTIs came to ED with complaints of lower back/flank pain, chills, suprapubic pain, stress incontinence.  Patient was diagnosed with possible pyelonephritis and started on IV antibiotics.  Urine culture has been obtained.  Patient also complains of constipation for past 3 days.  Hospital Course:   1. Sepsis due to UTI-patient empirically started on ceftriaxone.  Urine culture grew Klebsiella, sensitive to Cipro, will discharge home on Po Cipro 500 mg bid x 7 days.   Sepsis physiology has resolved. WBC is 7.4 today.  2. Nausea vomiting- resolved.  3. Constipation-this can also cause stress incontinence.  1 dose of the Dulcolax  suppository.  MiraLAX twice a day. Constipation is resolved after mediations were given.  4. Hyperglycemia/prediabetes-HbA1c 6.0.  Counseled by diabetes coordinator.  5. Stress incontinence- resolved after treating UTI and constipation.    Procedures:  None   Consultations:  None   Discharge Exam: Vitals:   12/23/17 2159 12/24/17 0552  BP: 128/88 122/79  Pulse: 79 81  Resp: 18 18  Temp: 98.6 F (37 C) 98.3 F (36.8 C)  SpO2: 96% 98%    General: Appears in no acute distress Cardiovascular: S1S2 RRR Respiratory: Clear b/l  Discharge  Instructions   Discharge Instructions    Diet - low sodium heart healthy   Complete by:  As directed    Increase activity slowly   Complete by:  As directed      Allergies as of 12/24/2017   No Known Allergies     Medication List    TAKE these medications   ciprofloxacin 500 MG tablet Commonly known as:  CIPRO Take 1 tablet (500 mg total) by mouth 2 (two) times daily for 7 days.   ibuprofen 200 MG tablet Commonly known as:  ADVIL,MOTRIN Take 200 mg by mouth every 6 (six) hours as needed for fever, headache, mild pain or moderate pain.      No Known Allergies    The results of significant diagnostics from this hospitalization (including imaging, microbiology, ancillary and laboratory) are listed below for reference.    Significant Diagnostic Studies: Dg Chest Port 1 View  Result Date: 12/22/2017 CLINICAL DATA:  Nausea, vomiting, fever and diarrhea for the past 2 days. Smoker. EXAM: PORTABLE CHEST 1 VIEW COMPARISON:  12/16/2015. FINDINGS: Normal sized heart. Clear lungs. Stable minimal peribronchial thickening. Stable mild scoliosis. IMPRESSION: Stable minimal chronic bronchitic changes. No acute abnormality. Electronically Signed   By: Beckie Salts M.D.   On: 12/22/2017 15:25    Microbiology: Recent Results (from the past 240 hour(s))  Urine culture     Status: Abnormal   Collection Time: 12/22/17  1:50 PM  Result Value Ref Range Status   Specimen Description URINE, CLEAN CATCH  Final   Special Requests   Final    NONE Performed at [facility], [address] [place], [place] [address]    Culture >=100,000 COLONIES/mL KLEBSIELLA PNEUMONIAE (A)  Final   Report Status 12/24/2017 FINAL  Final   Organism ID, Bacteria KLEBSIELLA PNEUMONIAE (A)  Final      Susceptibility   Klebsiella pneumoniae - MIC*    AMPICILLIN >=32 RESISTANT Resistant     CEFAZOLIN <=4 SENSITIVE Sensitive     CEFTRIAXONE <=1 SENSITIVE Sensitive     CIPROFLOXACIN <=0.25 SENSITIVE Sensitive      GENTAMICIN <=1 SENSITIVE Sensitive     IMIPENEM <=0.25 SENSITIVE Sensitive     NITROFURANTOIN 32 SENSITIVE Sensitive     TRIMETH/SULFA <=20 SENSITIVE Sensitive     AMPICILLIN/SULBACTAM 4 SENSITIVE Sensitive     PIP/TAZO <=4 SENSITIVE Sensitive     Extended ESBL NEGATIVE Sensitive     * >=100,000 COLONIES/mL KLEBSIELLA PNEUMONIAE  Blood Culture (routine x 2)     Status: None (Preliminary result)   Collection Time: 12/22/17  3:21 PM  Result Value Ref Range Status   Specimen Description BLOOD RIGHT FOREARM  Final   Special Requests   Final    BOTTLES DRAWN AEROBIC AND ANAEROBIC Blood Culture adequate volume   Culture   Final    NO GROWTH 2 DAYS Performed at [facility], [address] [place], [place] [address]    Report Status PENDING  Incomplete  Blood Culture (routine x 2)     Status: None (Preliminary result)   Collection Time: 12/22/17  3:26 PM  Result Value Ref Range Status   Specimen Description BLOOD LEFT ANTECUBITAL  Final   Special Requests   Final    BOTTLES DRAWN AEROBIC AND ANAEROBIC Blood Culture results may not be optimal due to an inadequate volume of blood received in culture bottles   Culture   Final    NO GROWTH 2 DAYS Performed at [facility], [address], [place], [place] [address]    Report Status PENDING  Incomplete     Labs: Basic Metabolic Panel: Recent Labs  Lab 12/22/17 1409 12/23/17 0458 12/24/17 0521  NA 135 138 140  K 3.5 3.3* 3.9  CL 102 109 108  CO2 21* 23 24  GLUCOSE 151* 120* 114*  BUN 9 5* <5*  CREATININE 1.01* 0.86 0.77  CALCIUM 9.1 8.1* 8.6*   Liver Function Tests: Recent Labs  Lab 12/22/17 1409  AST 16  ALT 17  ALKPHOS 62  BILITOT 1.4*  PROT 7.2  ALBUMIN 3.7   No results for input(s): LIPASE, AMYLASE in the last 168 hours. No results for input(s): AMMONIA in the last 168 hours. CBC: Recent Labs  Lab 12/22/17 1409 12/23/17 0458 12/24/17 0521  WBC 16.9* 10.9* 7.4  NEUTROABS 13.7*  --   --   HGB  14.7 12.5 11.8*  HCT 45.3 37.5 35.6*  MCV 97.4 96.2 95.7  PLT 330 282 282       Signed:  Meredeth Ide MD.  [facility] 12/24/2017, 1:21 PM

## 2017-12-24 NOTE — Plan of Care (Signed)
  RD consulted for nutrition education regarding pre-diabetes.   Lab Results  Component Value Date   HGBA1C 6.0 (H) 12/22/2017   Pt was seen by diabetes coordinator yesterday.   Spoke with pt at bedside, who reports she consumes 2 meals per day (Breakfast: oatmeal or grits; Dinner: steak and potato or spaghetti). Pt reports that she usually skips lunch while she is working due to her schedule (she drinks a lot of coffee with sugar throughout the day). Pt reports she also consumes mainly sodas.   Per pt, her son also has pre-diabetes. Pt and son have been taking a series of diabetes prevention classes through [facility], which pt has found helpful. Encouraged pt to make healthful changes as a family unit to promote wellness.   RD provided "Healthy Habits to Prevent Diabetes" handout from the Academy of Nutrition and Dietetics. Discussed different food groups and their effects on blood sugar, emphasizing carbohydrate-containing foods. Provided list of carbohydrates and recommended serving sizes of common foods.  Discussed importance of controlled and consistent carbohydrate intake throughout the day. Provided examples of ways to balance meals/snacks and encouraged intake of high-fiber, whole grain complex carbohydrates. Teach back method used.  Expect fair to good compliance.  Body mass index is 34.12 kg/m. Pt meets criteria for obesity, class I based on current BMI.  Current diet order is regular, patient is consuming approximately 100% of meals at this time. Labs and medications reviewed. No further nutrition interventions warranted at this time. RD contact information provided. If additional nutrition issues arise, please re-consult RD.  Temprance Wyre A. Mayford Knife, RD, LDN, CDE Pager: [phone] After hours Pager: [phone]

## 2017-12-27 LAB — CULTURE, BLOOD (ROUTINE X 2)
Culture: NO GROWTH
Culture: NO GROWTH
SPECIAL REQUESTS: ADEQUATE

## 2019-01-12 ENCOUNTER — Other Ambulatory Visit: Payer: Self-pay

## 2019-01-12 DIAGNOSIS — Z20822 Contact with and (suspected) exposure to covid-19: Secondary | ICD-10-CM

## 2019-01-13 LAB — NOVEL CORONAVIRUS, NAA: SARS-CoV-2, NAA: NOT DETECTED

## 2019-01-19 ENCOUNTER — Other Ambulatory Visit: Payer: Self-pay

## 2019-01-19 DIAGNOSIS — Z20822 Contact with and (suspected) exposure to covid-19: Secondary | ICD-10-CM

## 2019-01-21 LAB — NOVEL CORONAVIRUS, NAA: SARS-CoV-2, NAA: NOT DETECTED

## 2019-01-24 ENCOUNTER — Encounter (HOSPITAL_COMMUNITY): Payer: Self-pay

## 2019-01-24 ENCOUNTER — Other Ambulatory Visit: Payer: Self-pay

## 2019-01-24 ENCOUNTER — Ambulatory Visit (HOSPITAL_COMMUNITY)
Admission: EM | Admit: 2019-01-24 | Discharge: 2019-01-24 | Disposition: A | Discharge status: Home / Self Care | Payer: Self-pay | Attending: Family Medicine | Admitting: Family Medicine

## 2019-01-24 DIAGNOSIS — K0889 Other specified disorders of teeth and supporting structures: Secondary | ICD-10-CM

## 2019-01-24 MED ORDER — TRAMADOL HCL 50 MG PO TABS: 50.0000 mg | ORAL_TABLET | Freq: Four times a day (QID) | ORAL | 0 refills | Status: AC | PRN

## 2019-01-24 MED ORDER — PENICILLIN V POTASSIUM 500 MG PO TABS: 500.0000 mg | ORAL_TABLET | Freq: Three times a day (TID) | ORAL | 0 refills | Status: DC

## 2019-01-24 NOTE — ED Provider Notes (Signed)
[facility]   [id number] 01/24/19 Arrival Time: 1021  ASSESSMENT & PLAN:  1. Pain, dental     No sign of abscess requiring I&D at this time. Discussed.  Meds ordered this encounter  Medications  . penicillin v potassium (VEETID) 500 MG tablet    Sig: Take 1 tablet (500 mg total) by mouth 3 (three) times daily.    Dispense:  30 tablet    Refill:  0  . traMADol (ULTRAM) 50 MG tablet    Sig: Take 1 tablet (50 mg total) by mouth every 6 (six) hours as needed.    Dispense:  10 tablet    Refill:  0    [place] Controlled Substances Registry consulted for this patient. I feel the risk/benefit ratio today is favorable for proceeding with this prescription for a controlled substance. Medication sedation precautions given.  Dental resource written instructions given. She will schedule dental evaluation as soon as possible if not improving over the next 24-48 hours. Work note provided.   Reviewed expectations re: course of current medical issues. Questions answered. Outlined signs and symptoms indicating need for more acute intervention. Patient verbalized understanding. After Visit Summary given.   SUBJECTIVE:  Colleen Russell is a 38 y.o. female who reports abrupt onset of left lower dental pain described as aching/throbbing with mild hot/cold sensitivity. Present for 1 days. H/O similar; "told tooth needs to be pulled but I can't afford it". Fever: absent. Tolerating PO intake but reports pain with chewing. Normal swallowing. She has not seen a dentist regularly in quite some time. No neck swelling or pain. OTC analgesics without relief.  ROS: As per HPI. All other systems negative.   OBJECTIVE: Vitals:   01/24/19 1055  BP: 127/83  Pulse: 82  Resp: 16  Temp: 98.3 F (36.8 C)  TempSrc: Oral  SpO2: 98%    General appearance: alert; no distress HENT: normocephalic; atraumatic; dentition: fair, in poor repair; left lower gums without areas of fluctuance, drainage, or  bleeding and with tenderness to palpation; normal jaw movement without difficulty Neck: supple without LAD; FROM; trachea midline CV: RRR Lungs: normal respirations; unlabored Abd: soft Skin: warm and dry Psychological: alert and cooperative; normal mood and affect  No Known Allergies  Past Medical History:  Diagnosis Date  . Abnormal Pap smear   . Depression    no meds, seeing counselor  . Diabetes mellitus in pregnancy 2009  . History of bladder infections   . Shortness of breath    pregnancy related  . Trichomonas    Social History   Socioeconomic History  . Marital status: Single    Spouse name: Not on file  . Number of children: Not on file  . Years of education: Not on file  . Highest education level: Not on file  Occupational History  . Not on file  Social Needs  . Financial resource strain: Not on file  . Food insecurity    Worry: Not on file    Inability: Not on file  . Transportation needs    Medical: Not on file    Non-medical: Not on file  Tobacco Use  . Smoking status: Current Every Day Smoker    Packs/day: 0.20    Types: Cigarettes    Last attempt to quit: 01/14/2011    Years since quitting: 8.0  . Smokeless tobacco: Never Used  Substance and Sexual Activity  . Alcohol use: No  . Drug use: No  . Sexual activity: Yes  Birth control/protection: Surgical  Lifestyle  . Physical activity    Days per week: Not on file    Minutes per session: Not on file  . Stress: Not on file  Relationships  . Social Herbalist on phone: Not on file    Gets together: Not on file    Attends religious service: Not on file    Active member of club or organization: Not on file    Attends meetings of clubs or organizations: Not on file    Relationship status: Not on file  . Intimate partner violence    Fear of current or ex partner: Not on file    Emotionally abused: Not on file    Physically abused: Not on file    Forced sexual activity: Not on file   Other Topics Concern  . Not on file  Social History Narrative   Lives with 3 children (ages 85, 1, and 12) and female partner, who is the father of youngest 2 children. Works as Quarry manager.    Family History  Problem Relation Age of Onset  . Hypertension Mother   . Diabetes Sister    Past Surgical History:  Procedure Laterality Date  . CESAREAN SECTION  2013   with Ronnie Doss, MD 01/24/19 [phone]

## 2019-01-24 NOTE — Discharge Instructions (Signed)
Be aware, pain medications may cause drowsiness. Please do not drive, operate heavy machinery or make important decisions while on this medication, it can cloud your judgement.  

## 2019-01-24 NOTE — ED Triage Notes (Signed)
Pt present left side dental pain, pt states that her tooth has broke off inside her gums. Symptoms started yesterday.

## 2019-02-02 ENCOUNTER — Other Ambulatory Visit: Payer: Self-pay

## 2019-02-02 DIAGNOSIS — Z20822 Contact with and (suspected) exposure to covid-19: Secondary | ICD-10-CM

## 2019-02-04 LAB — NOVEL CORONAVIRUS, NAA: SARS-CoV-2, NAA: NOT DETECTED

## 2019-02-09 ENCOUNTER — Telehealth: Payer: Self-pay | Admitting: General Practice

## 2019-02-09 NOTE — Telephone Encounter (Signed)
Gave patient negative covid test results Patient understood 

## 2019-02-24 ENCOUNTER — Other Ambulatory Visit: Payer: Self-pay

## 2019-02-24 DIAGNOSIS — Z20822 Contact with and (suspected) exposure to covid-19: Secondary | ICD-10-CM

## 2019-02-25 LAB — NOVEL CORONAVIRUS, NAA: SARS-CoV-2, NAA: NOT DETECTED

## 2019-06-21 ENCOUNTER — Other Ambulatory Visit: Payer: Self-pay

## 2019-06-21 ENCOUNTER — Encounter (HOSPITAL_COMMUNITY): Payer: Self-pay | Admitting: Emergency Medicine

## 2019-06-21 ENCOUNTER — Emergency Department (HOSPITAL_COMMUNITY)
Admission: EM | Admit: 2019-06-21 | Discharge: 2019-06-21 | Disposition: A | Discharge status: Home / Self Care | Payer: Self-pay | Attending: Emergency Medicine | Admitting: Emergency Medicine

## 2019-06-21 DIAGNOSIS — E119 Type 2 diabetes mellitus without complications: Secondary | ICD-10-CM | POA: Insufficient documentation

## 2019-06-21 DIAGNOSIS — N3001 Acute cystitis with hematuria: Secondary | ICD-10-CM | POA: Insufficient documentation

## 2019-06-21 DIAGNOSIS — F17210 Nicotine dependence, cigarettes, uncomplicated: Secondary | ICD-10-CM | POA: Insufficient documentation

## 2019-06-21 DIAGNOSIS — Z79899 Other long term (current) drug therapy: Secondary | ICD-10-CM | POA: Insufficient documentation

## 2019-06-21 LAB — BASIC METABOLIC PANEL
Anion gap: 12 (ref 5–15)
BUN: 15 mg/dL (ref 6–20)
CO2: 22 mmol/L (ref 22–32)
Calcium: 9.5 mg/dL (ref 8.9–10.3)
Chloride: 104 mmol/L (ref 98–111)
Creatinine, Ser: 0.93 mg/dL (ref 0.44–1.00)
GFR calc Af Amer: >60 mL/min (ref >60)
GFR calc non Af Amer: >60 mL/min (ref >60)
Glucose, Bld: 101 mg/dL — ABNORMAL HIGH (ref 70–99)
Potassium: 4.1 mmol/L (ref 3.5–5.1)
Sodium: 138 mmol/L (ref 135–145)

## 2019-06-21 LAB — URINALYSIS, ROUTINE W REFLEX MICROSCOPIC
Bacteria, UA: NONE SEEN
Bilirubin Urine: NEGATIVE
Glucose, UA: NEGATIVE mg/dL
Ketones, ur: NEGATIVE mg/dL
Nitrite: NEGATIVE
Protein, ur: 30 mg/dL — AB
RBC / HPF: >50 RBC/hpf — ABNORMAL HIGH (ref 0–5)
Specific Gravity, Urine: 1.015 (ref 1.005–1.030)
pH: 6 (ref 5.0–8.0)

## 2019-06-21 LAB — I-STAT BETA HCG BLOOD, ED (MC, WL, AP ONLY): I-stat hCG, quantitative: <5 m[IU]/mL (ref <5)

## 2019-06-21 LAB — CBC
HCT: 45.6 % (ref 36.0–46.0)
Hemoglobin: 15.3 g/dL — ABNORMAL HIGH (ref 12.0–15.0)
MCH: 32.1 pg (ref 26.0–34.0)
MCHC: 33.6 g/dL (ref 30.0–36.0)
MCV: 95.6 fL (ref 80.0–100.0)
Platelets: 347 10*3/uL (ref 150–400)
RBC: 4.77 MIL/uL (ref 3.87–5.11)
RDW: 13 % (ref 11.5–15.5)
WBC: 12 10*3/uL — ABNORMAL HIGH (ref 4.0–10.5)
nRBC: 0 % (ref 0.0–0.2)

## 2019-06-21 LAB — CBG MONITORING, ED: Glucose-Capillary: 98 mg/dL (ref 70–99)

## 2019-06-21 MED ORDER — PHENAZOPYRIDINE HCL 100 MG PO TABS
200.0000 mg | ORAL_TABLET | Freq: Once | ORAL | Status: AC
  Administered 2019-06-21: 200 mg via ORAL
  Filled 2019-06-21: qty 2

## 2019-06-21 MED ORDER — CEPHALEXIN 250 MG PO CAPS
500.0000 mg | ORAL_CAPSULE | Freq: Once | ORAL | Status: AC
  Administered 2019-06-21: 15:00:00 500 mg via ORAL
  Filled 2019-06-21: qty 2

## 2019-06-21 MED ORDER — CEPHALEXIN 500 MG PO CAPS: 500.0000 mg | ORAL_CAPSULE | Freq: Four times a day (QID) | ORAL | 0 refills | Status: DC

## 2019-06-21 NOTE — ED Notes (Signed)
Pt discharge instructions reviewed with the patient. The patient verbalized understanding of instructions. Pt discharged. 

## 2019-06-21 NOTE — Discharge Instructions (Addendum)
We are treating a urine infection, with an antibiotic, cephalexin.  To help healing, drink plenty of water every day.  Use Tylenol for fever or pain.  We sent a prescription to the [facility] pharmacy, which you can get without paying for it.  Also, we have made arrangements for you to be seen at [facility] paperwork.  Call them to schedule an appointment.

## 2019-06-21 NOTE — ED Triage Notes (Signed)
Pt reports urinary frequency, dysuria and urinary incontinence x3 days. She also endorses lower abd pain. She denies hematuria, hx of kidney stones. Pt also endorses dizziness for the past 2 days and states she is concerned her blood sugar may be low, she is borderline diabetic. A/ox4, resp e/u, nad.

## 2019-06-21 NOTE — ED Provider Notes (Signed)
[facility] EMERGENCY DEPARTMENT Provider Note   CSN: [id number] Arrival date & time: 06/21/19  1100     History Chief Complaint  Patient presents with  . Dizziness  . Urinary Frequency    Colleen Russell is a 39 y.o. female.  HPI She presents for evaluation of urinary symptoms.  She describes having painful urination, frequent urination and occasional hematuria for 3 days.  She has occasionally noticed some urinary incontinence.  She has nausea without vomiting.  She denies back pain or upper abdominal pain.  She has a history of similar symptoms with urinary tract infection.  No known sick contacts.  There are no other known modifying factors.    Past Medical History:  Diagnosis Date  . Abnormal Pap smear   . Depression    no meds, seeing counselor  . Diabetes mellitus in pregnancy 2009  . History of bladder infections   . Shortness of breath    pregnancy related  . Trichomonas     Patient Active Problem List   Diagnosis Date Noted  . Sepsis (HCC) 12/22/2017  . Acute pyelonephritis 12/22/2017  . Tobacco abuse 12/22/2017  . Nausea and vomiting 12/22/2017    Past Surgical History:  Procedure Laterality Date  . CESAREAN SECTION  2013   with BTL     OB History    Gravida  5   Para  3   Term  3   Preterm  0   AB  2   Living  3     SAB  1   TAB  1   Ectopic  0   Multiple  0   Live Births  1           Family History  Problem Relation Age of Onset  . Hypertension Mother   . Diabetes Sister     Social History   Tobacco Use  . Smoking status: Current Every Day Smoker    Packs/day: 0.20    Types: Cigarettes    Last attempt to quit: 01/14/2011    Years since quitting: 8.4  . Smokeless tobacco: Never Used  Substance Use Topics  . Alcohol use: No  . Drug use: No    Home Medications Prior to Admission medications   Medication Sig Start Date End Date Taking? Authorizing Provider  cephALEXin (KEFLEX) 500 MG capsule  Take 1 capsule (500 mg total) by mouth 4 (four) times daily. 06/21/19   Mancel Bale, MD  ibuprofen (ADVIL,MOTRIN) 200 MG tablet Take 200 mg by mouth every 6 (six) hours as needed for fever, headache, mild pain or moderate pain.    [provider]  penicillin v potassium (VEETID) 500 MG tablet Take 1 tablet (500 mg total) by mouth 3 (three) times daily. 01/24/19   Mardella Layman, MD  traMADol (ULTRAM) 50 MG tablet Take 1 tablet (50 mg total) by mouth every 6 (six) hours as needed. 01/24/19   Mardella Layman, MD    Allergies    Patient has no known allergies.  Review of Systems   Review of Systems  All other systems reviewed and are negative.   Physical Exam Updated Vital Signs BP 133/80   Pulse 86   Temp 98.4 F (36.9 C)   Resp 18   SpO2 98%   Physical Exam Vitals and nursing note reviewed.  Constitutional:      General: She is not in acute distress.    Appearance: She is well-developed. She is obese. She is  not ill-appearing, toxic-appearing or diaphoretic.  HENT:     Head: Normocephalic and atraumatic.     Right Ear: External ear normal.     Left Ear: External ear normal.  Eyes:     Conjunctiva/sclera: Conjunctivae normal.     Pupils: Pupils are equal, round, and reactive to light.  Neck:     Trachea: Phonation normal.  Cardiovascular:     Rate and Rhythm: Normal rate and regular rhythm.     Heart sounds: Normal heart sounds.  Pulmonary:     Effort: Pulmonary effort is normal.     Breath sounds: Normal breath sounds.  Abdominal:     General: There is no distension.     Palpations: Abdomen is soft. There is no mass.     Tenderness: There is abdominal tenderness (Suprapubic, mild). There is no guarding or rebound.  Genitourinary:    Comments: No costovertebral angle tenderness with percussion Musculoskeletal:        General: Normal range of motion.     Cervical back: Normal range of motion and neck supple.  Skin:    General: Skin is warm and dry.    Neurological:     Mental Status: She is alert and oriented to person, place, and time.     Cranial Nerves: No cranial nerve deficit.     Sensory: No sensory deficit.     Motor: No abnormal muscle tone.     Coordination: Coordination normal.  Psychiatric:        Mood and Affect: Mood normal.        Behavior: Behavior normal.        Thought Content: Thought content normal.        Judgment: Judgment normal.     ED Results / Procedures / Treatments   Labs (all labs ordered are listed, but only abnormal results are displayed) Labs Reviewed  URINALYSIS, ROUTINE W REFLEX MICROSCOPIC - Abnormal; Notable for the following components:      Result Value   APPearance CLOUDY (*)    Hgb urine dipstick LARGE (*)    Protein, ur 30 (*)    Leukocytes,Ua MODERATE (*)    RBC / HPF >50 (*)    All other components within normal limits  BASIC METABOLIC PANEL - Abnormal; Notable for the following components:   Glucose, Bld 101 (*)    All other components within normal limits  CBC - Abnormal; Notable for the following components:   WBC 12.0 (*)    Hemoglobin 15.3 (*)    All other components within normal limits  URINE CULTURE  I-STAT BETA HCG BLOOD, ED ([facility], [facility], [facility] ONLY)  CBG MONITORING, ED  I-STAT BETA HCG BLOOD, ED ([facility], [facility], [facility] ONLY)    EKG EKG Interpretation  Date/Time:  Sunday June 21 2019 11:11:30 EST Ventricular Rate:  95 PR Interval:  130 QRS Duration: 82 QT Interval:  352 QTC Calculation: 442 R Axis:   97 Text Interpretation: Normal sinus rhythm Rightward axis Borderline ECG Since last tracing rate slower Otherwise no significant change Confirmed by Mancel Bale [phone]) on 06/21/2019 12:49:07 PM   Radiology No results found.  Procedures Procedures (including critical care time)  Medications Ordered in ED Medications  cephALEXin (KEFLEX) capsule 500 mg (500 mg Oral Given 06/21/19 1432)  phenazopyridine (PYRIDIUM) tablet 200 mg (200 mg Oral Given 06/21/19 1432)    ED Course   I have reviewed the triage vital signs and the nursing notes.  Pertinent labs & imaging results that  were available during my care of the patient were reviewed by me and considered in my medical decision making (see chart for details).  Clinical Course as of Jun 21 1519  Sun Jun 21, 2019  1247 Normal except presence of hemoglobin, protein, leukocytes, RBCs, WBCs, squamous epithelials  Urinalysis, Routine w reflex microscopic- may I&O cath if menses(!) [EW]  1247 Normal except white count high, hemoglobin elevated  CBC(!) [EW]  1248 Normal  I-Stat beta hCG blood, ED [EW]  1248 Normal  Basic metabolic panel(!) [EW]  6314 Transition of care team, arranged for follow-up at the wellness clinic and a voucher for antibiotic prescription sent to [facility].   [EW]    Clinical Course User Index [EW] Daleen Bo, MD   MDM Rules/Calculators/A&P                       Patient Vitals for the past 24 hrs:  BP Temp Pulse Resp SpO2  06/21/19 1430 133/80 -- 86 18 98 %  06/21/19 1345 139/76 -- 71 18 98 %  06/21/19 1111 (!) 117/100 98.4 F (36.9 C) 94 15 99 %    3:21 PM Reevaluation with update and discussion. After initial assessment and treatment, an updated evaluation reveals no change in clinical status, findings discussed with the patient all questions answered. Daleen Bo   Medical Decision Making: Evaluation consistent with cystitis, with bleeding.  Doubt serious bacterial infection, metabolic instability or impending vascular collapse.  KENITHA GLENDINNING was evaluated in Emergency Department on 06/21/2019 for the symptoms described in the history of present illness. She was evaluated in the context of the global COVID-19 pandemic, which necessitated consideration that the patient might be at risk for infection with the SARS-CoV-2 virus that causes COVID-19. Institutional protocols and algorithms that pertain to the evaluation of patients at risk for COVID-19 are in a state of rapid  change based on information released by regulatory bodies including the CDC and federal and state organizations. These policies and algorithms were followed during the patient's care in the ED.   CRITICAL CARE- no Performed by: Daleen Bo  Nursing Notes Reviewed/ Care Coordinated Applicable Imaging Reviewed Interpretation of Laboratory Data incorporated into ED treatment  The patient appears reasonably screened and/or stabilized for discharge and I doubt any other medical condition or other [facility] requiring further screening, evaluation, or treatment in the ED at this time prior to discharge.  Plan: Home Medications-OTC analgesia of choice; Home Treatments-rest, fluids; return here if the recommended treatment, does not improve the symptoms; Recommended follow up-PCP follow-up for ongoing management as listed on AVS.   Final Clinical Impression(s) / ED Diagnoses Final diagnoses:  Acute cystitis with hematuria    Rx / DC Orders ED Discharge Orders         Ordered    cephALEXin (KEFLEX) 500 MG capsule  4 times daily     06/21/19 1519           Daleen Bo, MD 06/21/19 [phone]

## 2019-06-21 NOTE — Care Management (Signed)
  [facility] Medication Assistance Card Name: Isra, Lindy (MRN): [id number] Bin: [id number] RX Group: [id number] Discharge Date: 06/21/2019 Expiration Date: 07/01/2019                                          (must be filled within 7 days of discharge)       Dear   :  Davene Costain  You have been approved to have the prescriptions written by your discharging physician filled through our [facility] (Medication Assistance Through [facility]) program. This program allows for a one-time (no refills) 34-day supply of selected medications for a low copay amount.  The copay is $3.00 per prescription. For instance, if you have one prescription, you will pay $3.00; for two prescriptions, you pay $6.00; for three prescriptions, you pay $9.00; and so on.  Only certain pharmacies are participating in this program with [facility]. You will need to select one of the pharmacies from the attached list and take your prescriptions, this letter, and your photo ID to one of the participating pharmacies.   We are excited that you are able to use the [facility] program to get your medications. These prescriptions must be filled within 7 days of hospital discharge or they will no longer be valid for the [facility] program. Should you have any problems with your prescriptions please contact your case management team member at [phone] for [place]/ [facility].  Thank you, [facility] Management

## 2019-06-21 NOTE — Care Management (Signed)
ED CM noted patient not have a PCP, patient is uninsured. Patient is eligible for the [facility] Access to Care program. Patient is a [facility].  Messaged the [facility] Case Manage with patient's information to reach out to patient to schedule follow up appointment.

## 2019-06-22 LAB — URINE CULTURE

## 2019-07-15 ENCOUNTER — Other Ambulatory Visit: Payer: Self-pay

## 2019-07-15 ENCOUNTER — Ambulatory Visit (HOSPITAL_COMMUNITY)
Admission: EM | Admit: 2019-07-15 | Discharge: 2019-07-15 | Disposition: A | Discharge status: Home / Self Care | Payer: HRSA Program | Attending: Family Medicine | Admitting: Family Medicine

## 2019-07-15 ENCOUNTER — Encounter (HOSPITAL_COMMUNITY): Payer: Self-pay

## 2019-07-15 DIAGNOSIS — Z20822 Contact with and (suspected) exposure to covid-19: Secondary | ICD-10-CM | POA: Insufficient documentation

## 2019-07-15 DIAGNOSIS — F17210 Nicotine dependence, cigarettes, uncomplicated: Secondary | ICD-10-CM | POA: Diagnosis not present

## 2019-07-15 DIAGNOSIS — Z833 Family history of diabetes mellitus: Secondary | ICD-10-CM | POA: Diagnosis not present

## 2019-07-15 DIAGNOSIS — R0981 Nasal congestion: Secondary | ICD-10-CM | POA: Diagnosis not present

## 2019-07-15 DIAGNOSIS — R519 Headache, unspecified: Secondary | ICD-10-CM | POA: Diagnosis not present

## 2019-07-15 DIAGNOSIS — R05 Cough: Secondary | ICD-10-CM | POA: Insufficient documentation

## 2019-07-15 DIAGNOSIS — Z8249 Family history of ischemic heart disease and other diseases of the circulatory system: Secondary | ICD-10-CM | POA: Insufficient documentation

## 2019-07-15 DIAGNOSIS — R6889 Other general symptoms and signs: Secondary | ICD-10-CM | POA: Insufficient documentation

## 2019-07-15 LAB — POC INFLUENZA A AND B ANTIGEN (URGENT CARE ONLY)
Influenza A Ag: NEGATIVE
Influenza B Ag: NEGATIVE

## 2019-07-15 LAB — INFLUENZA A AND B ANTIGEN (CONVERTED LAB)
Influenza A Ag: NEGATIVE
Influenza B Ag: NEGATIVE

## 2019-07-15 MED ORDER — ONDANSETRON 4 MG PO TBDP
ORAL_TABLET | ORAL | Status: AC
  Filled 2019-07-15: qty 1

## 2019-07-15 MED ORDER — ONDANSETRON 4 MG PO TBDP: 8.0000 mg | ORAL_TABLET | Freq: Once | ORAL | Status: DC

## 2019-07-15 MED ORDER — ONDANSETRON 4 MG PO TBDP
4.0000 mg | ORAL_TABLET | Freq: Once | ORAL | Status: AC
  Administered 2019-07-15: 4 mg via ORAL

## 2019-07-15 MED ORDER — ONDANSETRON HCL 4 MG PO TABS: 4.0000 mg | ORAL_TABLET | Freq: Four times a day (QID) | ORAL | 0 refills | Status: DC

## 2019-07-15 MED ORDER — BENZONATATE 200 MG PO CAPS: 200.0000 mg | ORAL_CAPSULE | Freq: Two times a day (BID) | ORAL | 0 refills | Status: DC | PRN

## 2019-07-15 NOTE — ED Provider Notes (Signed)
[facility]    CSN: [id number] Arrival date & time: 07/15/19  0901      History   Chief Complaint Chief Complaint  Patient presents with  . Nausea  . Migraine  . Nasal Congestion  . Cough  . Chills  . Generalized Body Aches    HPI Colleen Russell is a 39 y.o. female.   HPI  Patient works as a Lawyer at a nursing home.  She states that there is no illness that she knows of going to the home.  She has had her Covid immunizations.  She started with symptoms yesterday, nausea, fever chills, body aches, cough, headache.  She had a Covid test at work this morning that was negative.  She states that no one at home is sick. She requests confirmation of her Covid test.  Swab was sent today. We talked about viral illness, influenza, Covid, any number of infections that could make her feel sick.  Temperature today is 100.2.  She has not had any medication. Patient is a smoker.  Denies underlying asthma or lung disease.  Past Medical History:  Diagnosis Date  . Abnormal Pap smear   . Depression    no meds, seeing counselor  . Diabetes mellitus in pregnancy 2009  . History of bladder infections   . Shortness of breath    pregnancy related  . Trichomonas     Patient Active Problem List   Diagnosis Date Noted  . Sepsis (HCC) 12/22/2017  . Acute pyelonephritis 12/22/2017  . Tobacco abuse 12/22/2017  . Nausea and vomiting 12/22/2017    Past Surgical History:  Procedure Laterality Date  . CESAREAN SECTION  2013   with BTL    OB History    Gravida  5   Para  3   Term  3   Preterm  0   AB  2   Living  3     SAB  1   TAB  1   Ectopic  0   Multiple  0   Live Births  1            Home Medications    Prior to Admission medications   Medication Sig Start Date End Date Taking? Authorizing Provider  benzonatate (TESSALON) 200 MG capsule Take 1 capsule (200 mg total) by mouth 2 (two) times daily as needed for cough. 07/15/19   Eustace Moore, MD  cephALEXin (KEFLEX) 500 MG capsule Take 1 capsule (500 mg total) by mouth 4 (four) times daily. 06/21/19   Mancel Bale, MD  ibuprofen (ADVIL,MOTRIN) 200 MG tablet Take 200 mg by mouth every 6 (six) hours as needed for fever, headache, mild pain or moderate pain.    [provider]  ondansetron (ZOFRAN) 4 MG tablet Take 1 tablet (4 mg total) by mouth every 6 (six) hours. 07/15/19   Eustace Moore, MD  penicillin v potassium (VEETID) 500 MG tablet Take 1 tablet (500 mg total) by mouth 3 (three) times daily. 01/24/19   Mardella Layman, MD  traMADol (ULTRAM) 50 MG tablet Take 1 tablet (50 mg total) by mouth every 6 (six) hours as needed. 01/24/19   Mardella Layman, MD    Family History Family History  Problem Relation Age of Onset  . Hypertension Mother   . Diabetes Sister     Social History Social History   Tobacco Use  . Smoking status: Current Every Day Smoker    Packs/day: 0.20    Types:  Cigarettes    Last attempt to quit: 01/14/2011    Years since quitting: 8.5  . Smokeless tobacco: Never Used  Substance Use Topics  . Alcohol use: No  . Drug use: No     Allergies   Patient has no known allergies.   Review of Systems Review of Systems  Constitutional: Positive for chills, fatigue and fever.  HENT: Positive for congestion.   Respiratory: Positive for cough.   Musculoskeletal: Positive for myalgias.  Neurological: Positive for headaches.     Physical Exam Triage Vital Signs ED Triage Vitals  Enc Vitals Group     BP 07/15/19 0920 138/86     Pulse Rate 07/15/19 0920 92     Resp 07/15/19 0920 19     Temp 07/15/19 0920 100.2 F (37.9 C)     Temp Source 07/15/19 0920 Oral     SpO2 07/15/19 0920 100 %     Weight --      Height --      Head Circumference --      Peak Flow --      Pain Score 07/15/19 0918 8     Pain Loc --      Pain Edu? --      Excl. in [place]? --    No data found.  Updated Vital Signs BP 138/86 (BP Location: Left Arm)   Pulse  92   Temp 100.2 F (37.9 C) (Oral)   Resp 19   LMP 07/03/2019 (Exact Date)   SpO2 100%   Physical Exam Constitutional:      General: She is in acute distress.     Appearance: She is well-developed. She is ill-appearing.  HENT:     Head: Normocephalic and atraumatic.     Mouth/Throat:     Comments: Mask is in place Eyes:     Conjunctiva/sclera: Conjunctivae normal.     Pupils: Pupils are equal, round, and reactive to light.  Cardiovascular:     Rate and Rhythm: Normal rate and regular rhythm.     Heart sounds: Normal heart sounds.  Pulmonary:     Effort: Pulmonary effort is normal. No respiratory distress.     Breath sounds: Normal breath sounds. No wheezing or rhonchi.  Musculoskeletal:        General: Normal range of motion.     Cervical back: Normal range of motion.  Skin:    General: Skin is warm and dry.  Neurological:     General: No focal deficit present.     Mental Status: She is alert.  Psychiatric:        Mood and Affect: Mood normal.        Behavior: Behavior normal.      UC Treatments / Results  Labs (all labs ordered are listed, but only abnormal results are displayed) Labs Reviewed  SARS CORONAVIRUS 2 (TAT 6-24 HRS)  INFLUENZA A AND B ANTIGEN (CONVERTED LAB)   Rapid flu is negative EKG   Radiology No results found.  Procedures Procedures (including critical care time)  Medications Ordered in UC Medications  ondansetron (ZOFRAN-ODT) disintegrating tablet 4 mg (4 mg Oral Given 07/15/19 0950)    Initial Impression / Assessment and Plan / UC Course  I have reviewed the triage vital signs and the nursing notes.  Pertinent labs & imaging results that were available during my care of the patient were reviewed by me and considered in my medical decision making (see chart for details).     See  HPI.  Discussed viral illnesses.  Flu is negative.  Coronavirus is negative.  Likely respiratory virus.  Treat symptomatically.  Quarantine until [employer]  test is available Final Clinical Impressions(s) / UC Diagnoses   Final diagnoses:  Flu-like symptoms     Discharge Instructions     Drink plenty of fluids Take the zofran as needed for nausea Take the tessalon as needed cough The COVID test is available on My Chart    ED Prescriptions    Medication Sig Dispense Auth. Provider   ondansetron (ZOFRAN) 4 MG tablet Take 1 tablet (4 mg total) by mouth every 6 (six) hours. 12 tablet Eustace Moore, MD   benzonatate (TESSALON) 200 MG capsule Take 1 capsule (200 mg total) by mouth 2 (two) times daily as needed for cough. 20 capsule Eustace Moore, MD     PDMP not reviewed this encounter.   Eustace Moore, MD 07/15/19 [phone]

## 2019-07-15 NOTE — ED Notes (Signed)
Pt did the Flub swab herself, as she refused to let me do it. Dr. Delton See was informed.

## 2019-07-15 NOTE — Discharge Instructions (Signed)
Drink plenty of fluids Take the zofran as needed for nausea Take the tessalon as needed cough The COVID test is available on My Chart

## 2019-07-15 NOTE — ED Triage Notes (Signed)
Pt presents to UC with migraine, nausea, chills, body aches and cough x 1 day. Pt reports she had negative rapid COVID test done this morning. Pt reports she had the second COVID vaccine in February.

## 2019-07-16 LAB — SARS CORONAVIRUS 2 (TAT 6-24 HRS): SARS Coronavirus 2: NEGATIVE

## 2021-02-19 ENCOUNTER — Other Ambulatory Visit: Payer: Self-pay

## 2021-02-19 ENCOUNTER — Encounter (HOSPITAL_COMMUNITY): Payer: Self-pay | Admitting: Emergency Medicine

## 2021-02-19 ENCOUNTER — Emergency Department (HOSPITAL_COMMUNITY)
Admission: EM | Admit: 2021-02-19 | Discharge: 2021-02-19 | Disposition: A | Discharge status: Home / Self Care | Payer: Self-pay | Attending: Emergency Medicine | Admitting: Emergency Medicine

## 2021-02-19 ENCOUNTER — Emergency Department (HOSPITAL_COMMUNITY): Payer: Self-pay

## 2021-02-19 DIAGNOSIS — J101 Influenza due to other identified influenza virus with other respiratory manifestations: Secondary | ICD-10-CM | POA: Insufficient documentation

## 2021-02-19 DIAGNOSIS — R0602 Shortness of breath: Secondary | ICD-10-CM | POA: Insufficient documentation

## 2021-02-19 DIAGNOSIS — H5789 Other specified disorders of eye and adnexa: Secondary | ICD-10-CM | POA: Insufficient documentation

## 2021-02-19 DIAGNOSIS — R059 Cough, unspecified: Secondary | ICD-10-CM | POA: Insufficient documentation

## 2021-02-19 DIAGNOSIS — J029 Acute pharyngitis, unspecified: Secondary | ICD-10-CM | POA: Insufficient documentation

## 2021-02-19 DIAGNOSIS — Z5321 Procedure and treatment not carried out due to patient leaving prior to being seen by health care provider: Secondary | ICD-10-CM | POA: Insufficient documentation

## 2021-02-19 DIAGNOSIS — R6883 Chills (without fever): Secondary | ICD-10-CM | POA: Insufficient documentation

## 2021-02-19 DIAGNOSIS — Z20822 Contact with and (suspected) exposure to covid-19: Secondary | ICD-10-CM | POA: Insufficient documentation

## 2021-02-19 LAB — RESP PANEL BY RT-PCR (FLU A&B, COVID) ARPGX2
Influenza A by PCR: NEGATIVE
Influenza B by PCR: NEGATIVE
SARS Coronavirus 2 by RT PCR: NEGATIVE

## 2021-02-19 NOTE — ED Provider Notes (Signed)
Emergency Medicine Provider Triage Evaluation Note  Colleen Russell , a 40 y.o. female  was evaluated in triage.  Pt complains of dry cough for the past 2 to 3 days.  She also complains of sinus congestion, sore throat.  She states that her eyes have been crusted shut in the morning and were red and painful.  She states she is concerned she has pinkeye.  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  She would like a COVID test at this time.  She has been taking over-the-counter DayQuil and NyQuil with mild relief.  Denies any blurry vision or double vision..  Review of Systems  Positive: + cough, sore throat, chills, congestion, eye redness Negative: - difficulty swallowing, SOB  Physical Exam  BP (!) 142/104 (BP Location: Right Arm)   Pulse 92   Temp 98.4 F (36.9 C) (Oral)   Resp 16   SpO2 98%  Gen:   Awake, no distress   Resp:  Normal effort  MSK:   Moves extremities without difficulty  Other:  Congestion. Bilateral conjunctival injection. PERRL. EOMI intact.   Medical Decision Making  Medically screening exam initiated at 1:34 PM.  Appropriate orders placed.  Bland Span was informed that the remainder of the evaluation will be completed by another provider, this initial triage assessment does not replace that evaluation, and the importance of remaining in the ED until their evaluation is complete.     Tanda Rockers, PA-C 02/19/21 1335    Rolan Bucco, MD 02/19/21 1445

## 2021-02-19 NOTE — ED Triage Notes (Signed)
Pt reports redness to bilateral eyes, sore throat, sob, cough, and chills since Thursday.

## 2021-03-14 ENCOUNTER — Emergency Department (HOSPITAL_COMMUNITY)
Admission: EM | Admit: 2021-03-14 | Discharge: 2021-03-14 | Disposition: A | Discharge status: Home / Self Care | Payer: Self-pay | Attending: Emergency Medicine | Admitting: Emergency Medicine

## 2021-03-14 DIAGNOSIS — N200 Calculus of kidney: Secondary | ICD-10-CM | POA: Diagnosis not present

## 2021-03-14 DIAGNOSIS — R1030 Lower abdominal pain, unspecified: Secondary | ICD-10-CM | POA: Diagnosis not present

## 2021-03-14 DIAGNOSIS — Z5321 Procedure and treatment not carried out due to patient leaving prior to being seen by health care provider: Secondary | ICD-10-CM | POA: Insufficient documentation

## 2021-03-14 DIAGNOSIS — R197 Diarrhea, unspecified: Secondary | ICD-10-CM | POA: Insufficient documentation

## 2021-03-14 DIAGNOSIS — R112 Nausea with vomiting, unspecified: Secondary | ICD-10-CM | POA: Insufficient documentation

## 2021-03-14 DIAGNOSIS — R110 Nausea: Secondary | ICD-10-CM | POA: Diagnosis not present

## 2021-03-14 DIAGNOSIS — R1084 Generalized abdominal pain: Secondary | ICD-10-CM | POA: Diagnosis not present

## 2021-03-14 LAB — COMPREHENSIVE METABOLIC PANEL
ALT: 21 U/L (ref 0–44)
AST: 15 U/L (ref 15–41)
Albumin: 3.9 g/dL (ref 3.5–5.0)
Alkaline Phosphatase: 69 U/L (ref 38–126)
Anion gap: 11 (ref 5–15)
BUN: 10 mg/dL (ref 6–20)
CO2: 22 mmol/L (ref 22–32)
Calcium: 9.3 mg/dL (ref 8.9–10.3)
Chloride: 103 mmol/L (ref 98–111)
Creatinine, Ser: 0.86 mg/dL (ref 0.44–1.00)
GFR, Estimated: >60 mL/min (ref >60)
Glucose, Bld: 113 mg/dL — ABNORMAL HIGH (ref 70–99)
Potassium: 3.6 mmol/L (ref 3.5–5.1)
Sodium: 136 mmol/L (ref 135–145)
Total Bilirubin: 0.8 mg/dL (ref 0.3–1.2)
Total Protein: 7.8 g/dL (ref 6.5–8.1)

## 2021-03-14 LAB — CBC WITH DIFFERENTIAL/PLATELET
Abs Immature Granulocytes: 0.05 10*3/uL (ref 0.00–0.07)
Basophils Absolute: 0.1 10*3/uL (ref 0.0–0.1)
Basophils Relative: 0 %
Eosinophils Absolute: 0 10*3/uL (ref 0.0–0.5)
Eosinophils Relative: 0 %
HCT: 44.7 % (ref 36.0–46.0)
Hemoglobin: 15.1 g/dL — ABNORMAL HIGH (ref 12.0–15.0)
Immature Granulocytes: 0 %
Lymphocytes Relative: 12 %
Lymphs Abs: 1.5 10*3/uL (ref 0.7–4.0)
MCH: 32.2 pg (ref 26.0–34.0)
MCHC: 33.8 g/dL (ref 30.0–36.0)
MCV: 95.3 fL (ref 80.0–100.0)
Monocytes Absolute: 0.8 10*3/uL (ref 0.1–1.0)
Monocytes Relative: 7 %
Neutro Abs: 10 10*3/uL — ABNORMAL HIGH (ref 1.7–7.7)
Neutrophils Relative %: 81 %
Platelets: 337 10*3/uL (ref 150–400)
RBC: 4.69 MIL/uL (ref 3.87–5.11)
RDW: 12.5 % (ref 11.5–15.5)
WBC: 12.5 10*3/uL — ABNORMAL HIGH (ref 4.0–10.5)
nRBC: 0 % (ref 0.0–0.2)

## 2021-03-14 LAB — I-STAT BETA HCG BLOOD, ED (MC, WL, AP ONLY): I-stat hCG, quantitative: <5 m[IU]/mL (ref <5)

## 2021-03-14 LAB — LIPASE, BLOOD: Lipase: 20 U/L (ref 11–51)

## 2021-03-14 MED ORDER — ONDANSETRON 4 MG PO TBDP: 4.0000 mg | ORAL_TABLET | Freq: Once | ORAL | Status: DC

## 2021-03-14 NOTE — ED Provider Notes (Signed)
Emergency Medicine Provider Triage Evaluation Note  Colleen Russell , a 40 y.o. female  was evaluated in triage.  Pt complains of BL flank pain, NVD abd pain. Vomiting and diarrhea started last night.   No fevers.  Review of Systems  Positive: NVD, abd pain, cough congestion fatigue Negative: Fever   Physical Exam  BP 132/77 (BP Location: Left Arm)   Pulse (!) 108   Temp 98.7 F (37.1 C) (Oral)   Resp 18   SpO2 99%  Gen:   Awake, no distress   Resp:  Normal effort  MSK:   Moves extremities without difficulty  Other:  Abd soft  Medical Decision Making  Medically screening exam initiated at 11:42 AM.  Appropriate orders placed.  Colleen Russell was informed that the remainder of the evaluation will be completed by another provider, this initial triage assessment does not replace that evaluation, and the importance of remaining in the ED until their evaluation is complete.  Abd labs   Gailen Shelter, [place] 03/14/21 1146    Linwood Dibbles, MD 03/15/21 [phone]

## 2021-03-14 NOTE — ED Triage Notes (Signed)
Per EMS-states she was diagnosed with flu about 2 weeks ago-states she has been having N/V/D since last night-lower abdominal pain radiating right flank-history of kidney stones

## 2021-03-15 ENCOUNTER — Encounter (HOSPITAL_COMMUNITY): Payer: Self-pay | Admitting: Emergency Medicine

## 2021-03-15 ENCOUNTER — Other Ambulatory Visit: Payer: Self-pay

## 2021-03-15 ENCOUNTER — Emergency Department (HOSPITAL_COMMUNITY): Payer: Self-pay

## 2021-03-15 ENCOUNTER — Emergency Department (HOSPITAL_COMMUNITY)
Admission: EM | Admit: 2021-03-15 | Discharge: 2021-03-15 | Disposition: A | Discharge status: Home / Self Care | Payer: Self-pay | Attending: Emergency Medicine | Admitting: Emergency Medicine

## 2021-03-15 DIAGNOSIS — N12 Tubulo-interstitial nephritis, not specified as acute or chronic: Secondary | ICD-10-CM

## 2021-03-15 DIAGNOSIS — N390 Urinary tract infection, site not specified: Secondary | ICD-10-CM | POA: Insufficient documentation

## 2021-03-15 DIAGNOSIS — Z20822 Contact with and (suspected) exposure to covid-19: Secondary | ICD-10-CM | POA: Insufficient documentation

## 2021-03-15 DIAGNOSIS — F17210 Nicotine dependence, cigarettes, uncomplicated: Secondary | ICD-10-CM | POA: Insufficient documentation

## 2021-03-15 DIAGNOSIS — J111 Influenza due to unidentified influenza virus with other respiratory manifestations: Secondary | ICD-10-CM

## 2021-03-15 LAB — URINALYSIS, ROUTINE W REFLEX MICROSCOPIC
Bilirubin Urine: NEGATIVE
Glucose, UA: NEGATIVE mg/dL
Ketones, ur: 15 mg/dL — AB
Nitrite: POSITIVE — AB
Protein, ur: 100 mg/dL — AB
Specific Gravity, Urine: 1.015 (ref 1.005–1.030)
pH: 6 (ref 5.0–8.0)

## 2021-03-15 LAB — URINALYSIS, MICROSCOPIC (REFLEX)
RBC / HPF: >50 RBC/hpf (ref 0–5)
WBC, UA: >50 WBC/hpf (ref 0–5)

## 2021-03-15 LAB — RESP PANEL BY RT-PCR (FLU A&B, COVID) ARPGX2
Influenza A by PCR: POSITIVE — AB
Influenza B by PCR: NEGATIVE
SARS Coronavirus 2 by RT PCR: NEGATIVE

## 2021-03-15 MED ORDER — SODIUM CHLORIDE 0.9 % IV SOLN
1.0000 g | Freq: Once | INTRAVENOUS | Status: AC
  Administered 2021-03-15: 1 g via INTRAVENOUS
  Filled 2021-03-15: qty 10

## 2021-03-15 MED ORDER — ACETAMINOPHEN 325 MG PO TABS
650.0000 mg | ORAL_TABLET | Freq: Once | ORAL | Status: AC
  Administered 2021-03-15: 650 mg via ORAL
  Filled 2021-03-15: qty 2

## 2021-03-15 MED ORDER — ONDANSETRON 4 MG PO TBDP
4.0000 mg | ORAL_TABLET | Freq: Once | ORAL | Status: AC
  Administered 2021-03-15: 4 mg via ORAL
  Filled 2021-03-15: qty 1

## 2021-03-15 MED ORDER — CEFADROXIL 500 MG PO CAPS: 500.0000 mg | ORAL_CAPSULE | Freq: Two times a day (BID) | ORAL | 0 refills | Status: DC

## 2021-03-15 MED ORDER — SODIUM CHLORIDE 0.9 % IV BOLUS
1000.0000 mL | Freq: Once | INTRAVENOUS | Status: AC
  Administered 2021-03-15: 1000 mL via INTRAVENOUS

## 2021-03-15 MED ORDER — ONDANSETRON HCL 4 MG PO TABS: 4.0000 mg | ORAL_TABLET | Freq: Four times a day (QID) | ORAL | 0 refills | Status: DC

## 2021-03-15 NOTE — Discharge Instructions (Signed)
Please take the entire course of antibiotics that I have prescribed.  You can take the nausea medication as needed.  I encourage lots of fluids, rest.  Please follow-up if your symptoms worsen or fail to improve.

## 2021-03-15 NOTE — ED Provider Notes (Signed)
[facility] EMERGENCY DEPARTMENT Provider Note   CSN: [id number] Arrival date & time: 03/15/21  8242     History Chief Complaint  Patient presents with   Abdominal Pain    Colleen Russell is a 40 y.o. female who presents the emergency department with a 3-week history of dysuria and hematuria which is been waxing waning since onset.  Symptoms have also been worsening.  She now is having nausea, vomiting, subjective fevers, chills, and myalgias.  Patient also complains of bilateral flank pain which she rates moderate in severity.  She has not taken anything for symptoms.  Her hematuria has been improving.  Nothing seems to make her symptoms better or worse.   Abdominal Pain     Past Medical History:  Diagnosis Date   Abnormal Pap smear    Depression    no meds, seeing counselor   Diabetes mellitus in pregnancy 2009   History of bladder infections    Shortness of breath    pregnancy related   Trichomonas     Patient Active Problem List   Diagnosis Date Noted   Sepsis (HCC) 12/22/2017   Acute pyelonephritis 12/22/2017   Tobacco abuse 12/22/2017   Nausea and vomiting 12/22/2017    Past Surgical History:  Procedure Laterality Date   CESAREAN SECTION  2013   with BTL     OB History     Gravida  5   Para  3   Term  3   Preterm  0   AB  2   Living  3      SAB  1   IAB  1   Ectopic  0   Multiple  0   Live Births  1           Family History  Problem Relation Age of Onset   Hypertension Mother    Diabetes Sister     Social History   Tobacco Use   Smoking status: Every Day    Packs/day: 0.20    Types: Cigarettes    Last attempt to quit: 01/14/2011    Years since quitting: 10.1   Smokeless tobacco: Never  Substance Use Topics   Alcohol use: No   Drug use: No    Home Medications Prior to Admission medications   Medication Sig Start Date End Date Taking? Authorizing Provider  benzonatate (TESSALON) 200 MG  capsule Take 1 capsule (200 mg total) by mouth 2 (two) times daily as needed for cough. 07/15/19   Eustace Moore, MD  cephALEXin (KEFLEX) 500 MG capsule Take 1 capsule (500 mg total) by mouth 4 (four) times daily. 06/21/19   Mancel Bale, MD  ibuprofen (ADVIL,MOTRIN) 200 MG tablet Take 200 mg by mouth every 6 (six) hours as needed for fever, headache, mild pain or moderate pain.    [provider]  ondansetron (ZOFRAN) 4 MG tablet Take 1 tablet (4 mg total) by mouth every 6 (six) hours. 07/15/19   Eustace Moore, MD  penicillin v potassium (VEETID) 500 MG tablet Take 1 tablet (500 mg total) by mouth 3 (three) times daily. 01/24/19   Mardella Layman, MD  traMADol (ULTRAM) 50 MG tablet Take 1 tablet (50 mg total) by mouth every 6 (six) hours as needed. 01/24/19   Mardella Layman, MD    Allergies    Patient has no known allergies.  Review of Systems   Review of Systems  Gastrointestinal:  Positive for abdominal pain.  All other systems  reviewed and are negative.  Physical Exam Updated Vital Signs BP 133/79   Pulse (!) 103   Temp 99.2 F (37.3 C)   Resp 18   Ht 5\' 3"  (1.6 m)   Wt 92.5 kg   LMP 02/02/2021 Comment: Tubal ligation  SpO2 93%   BMI 36.14 kg/m   Physical Exam Vitals and nursing note reviewed.  Constitutional:      General: She is not in acute distress.    Appearance: Normal appearance.  HENT:     Head: Normocephalic and atraumatic.  Eyes:     General:        Right eye: No discharge.        Left eye: No discharge.  Cardiovascular:     Comments: Tachycardic.  S1/S2 are distinct without any evidence of murmur, rubs, or gallops.  Radial pulses are 2+ bilaterally.  Dorsalis pedis pulses are 2+ bilaterally.  No evidence of pedal edema. Pulmonary:     Comments: Clear to auscultation bilaterally.  Normal effort.  No respiratory distress.  No evidence of wheezes, rales, or rhonchi heard throughout. Abdominal:     General: Abdomen is flat. Bowel sounds are  normal. There is no distension.     Tenderness: There is generalized abdominal tenderness. There is right CVA tenderness and left CVA tenderness. There is no guarding or rebound.  Musculoskeletal:        General: Normal range of motion.     Cervical back: Neck supple.  Skin:    General: Skin is warm and dry.     Findings: No rash.  Neurological:     General: No focal deficit present.     Mental Status: She is alert.  Psychiatric:        Mood and Affect: Mood normal.        Behavior: Behavior normal.    ED Results / Procedures / Treatments   Labs (all labs ordered are listed, but only abnormal results are displayed) Labs Reviewed  RESP PANEL BY RT-PCR (FLU A&B, COVID) ARPGX2 - Abnormal; Notable for the following components:      Result Value   Influenza A by PCR POSITIVE (*)    All other components within normal limits  URINALYSIS, ROUTINE W REFLEX MICROSCOPIC - Abnormal; Notable for the following components:   APPearance HAZY (*)    Hgb urine dipstick LARGE (*)    Ketones, ur 15 (*)    Protein, ur 100 (*)    Nitrite POSITIVE (*)    Leukocytes,Ua LARGE (*)    All other components within normal limits  URINALYSIS, MICROSCOPIC (REFLEX) - Abnormal; Notable for the following components:   Bacteria, UA MANY (*)    Non Squamous Epithelial PRESENT (*)    All other components within normal limits  URINE CULTURE    EKG None  Radiology DG Chest 2 View  Result Date: 03/15/2021 CLINICAL DATA:  Cough.  Intermittent fever.  Flank pain. EXAM: CHEST - 2 VIEW COMPARISON:  02/19/2021 FINDINGS: Heart size is normal. Mediastinal shadows are normal. There may be mild central bronchial thickening but there is no infiltrate, collapse or effusion. No significant bone finding. IMPRESSION: Possible bronchitis.  No consolidation or collapse. Electronically Signed   By: Nelson Chimes M.D.   On: 03/15/2021 08:27    Procedures Procedures   Medications Ordered in ED Medications  ondansetron  (ZOFRAN-ODT) disintegrating tablet 4 mg (has no administration in time range)  acetaminophen (TYLENOL) tablet 650 mg (has no administration in time range)  sodium chloride 0.9 % bolus 1,000 mL (has no administration in time range)  cefTRIAXone (ROCEPHIN) 1 g in sodium chloride 0.9 % 100 mL IVPB (has no administration in time range)    ED Course  I have reviewed the triage vital signs and the nursing notes.  Pertinent labs & imaging results that were available during my care of the patient were reviewed by me and considered in my medical decision making (see chart for details).  Clinical Course as of 03/15/21 1540  Wed Mar 15, 2021  1532 3 weeks of UTI sx, dysuria, hematuria -- progressively worsening  Flu+ and UTI -- gram of rocephin, and fluids, recheck then DC [CP]    Clinical Course User Index [CP] Prosperi, Joesph Fillers, PA-C   MDM Rules/Calculators/A&P                          Colleen Russell is a 40 y.o. female who presents to the emergency department for evaluation of urinary tract infection symptoms and flulike symptoms.  Clinical picture is little cloudy.  Initial work-up was ordered in triage and she tested positive for influenza.  She also has a urinary tract infection.  Clinically, I am concerned that she does have pyelonephritis.  She is otherwise healthy with no significant comorbidities.  It is hard to differentiate whether or not her tachycardia and low-grade fever is coming from the flu or urinary tract infection.  She does have bilateral CVA tenderness.  Patient was seen at [facility] last night and had blood work done.  This was reviewed and showed leukocytosis.  She had normal kidney function at that time.  Lipase negative.   Given her symptoms I will empirically treat for pyelonephritis likely with 1 g ceftriaxone IV and a liter of fluid.  It is unclear when her symptoms started for the flu and likely she is not a candidate for Tamiflu. Pending her liter of fluid and  ceftriaxone plan to discharge home with PO antibiotics. The rest her care was transferred to [facility], PA-C   Final Clinical Impression(s) / ED Diagnoses Final diagnoses:  None    Rx / DC Orders ED Discharge Orders     None        Hendricks Limes, [place] 03/15/21 1541    Godfrey Pick, MD 03/15/21 [phone]

## 2021-03-15 NOTE — ED Provider Notes (Signed)
Accepted handoff at shift change from [facility]. Please see prior provider note for more detail. His HPI briefly:    Colleen Russell is a 40 y.o. female who presents the emergency department with a 3-week history of dysuria and hematuria which is been waxing waning since onset.  Symptoms have also been worsening.  She now is having nausea, vomiting, subjective fevers, chills, and myalgias.  Patient also complains of bilateral flank pain which she rates moderate in severity.  She has not taken anything for symptoms.  Her hematuria has been improving.  Nothing seems to make her symptoms better or worse.  Briefly: Patient is 40 y.o.   DDX: concern for URI, as well as UTI.   Plan: Patient getting fluids, 1 dose of ceftriaxone, and plan to discharge thereafter.  Patient reassessed after antibiotics, fluids, patient reports that she is overall feeling better.  We will send her with 1 week of cefadroxil, as well as some nausea medication for flulike symptoms.  Patient understands and agrees to plan, discharged in stable condition at this time.  Tachycardia markedly improved after fluid resuscitation.   [place] 03/15/21 1730    Wynetta Fines, MD 03/19/21 1459

## 2021-03-15 NOTE — ED Provider Notes (Signed)
Emergency Medicine Provider Triage Evaluation Note  Colleen Russell , a 40 y.o. female  was evaluated in triage.  Pt complains of feeling unwell.  Seen at [facility] yesterday, left due to long wait.  Had labs performed which did not show any significant abnormality. Negative pregnancy test.  Yesterday she told [facility] provider she had cough, congestion, myalgias, abdominal pain bilateral flank pain over the last 2 days.  Today she states this is been going on for 3 weeks.  Admits to some darkened urine, dysuria.  Has a cough which is nonproductive.  States she has a headache, muscle pain.  Feels nauseous.  Review of Systems  Positive: Chills, headache, cough, congestion, fatigue, bilateral flank pain, nausea, vomiting, dysuria Negative: fever  Physical Exam  BP 121/82 (BP Location: Right Arm)   Pulse (!) 126   Temp 99.2 F (37.3 C)   Resp 17   Ht 5\' 3"  (1.6 m)   Wt 92.5 kg   SpO2 99%   BMI 36.14 kg/m  Gen:   Awake, no distress   Resp:  Normal effort  ABD:  Soft MSK:   Moves extremities without difficulty  Other:    Medical Decision Making  Medically screening exam initiated at 7:53 AM.  Appropriate orders placed.  was informed that the remainder of the evaluation will be completed by another provider, this initial triage assessment does not replace that evaluation, and the importance of remaining in the ED until their evaluation is complete.  Multiple complaints, initially tachycardic however very tearful, crying, very came down once patient settled   Kaya Pottenger A, PA-C 03/15/21 0754    03/17/21, DO 03/15/21 [phone]

## 2021-03-15 NOTE — ED Triage Notes (Signed)
Patient tearful in triage reports "everything hurts." Having abdominal pain and N/V/D and dysuria x 3 weeks.

## 2021-03-18 LAB — URINE CULTURE: Culture: >=100000 — AB

## 2021-03-19 ENCOUNTER — Telehealth: Payer: Self-pay | Admitting: Emergency Medicine

## 2021-03-19 NOTE — Telephone Encounter (Signed)
Post ED Visit - Positive Culture Follow-up  Culture report reviewed by antimicrobial stewardship pharmacist: [facility] []  , Pharm.D. []  Enzo Bi, Pharm.D., BCPS AQ-ID []  , Pharm.D., BCPS []  Celedonio Miyamoto, .D., BCPS []  [place], .D., BCPS, AAHIVP []  Georgina Pillion, Pharm.D., BCPS, AAHIVP []  [address], PharmD, BCPS []  , PharmD, BCPS []  [place], PharmD, BCPS [x]  [address], PharmD []  , PharmD, BCPS []  Estella Husk, PharmD  Pharmacy Team []  Lysle Pearl, PharmD []  , PharmD []  Phillips Climes, PharmD []  , Rph []  Agapito Games) , PharmD []  Delmar Landau, PharmD []  , PharmD []  Mervyn Gay, PharmD []  , PharmD []  Vinnie Level, PharmD []  [facility], PharmD []  , PharmD []  Len Childs, PharmD   Positive urine culture Treated with Cefadroxil, organism sensitive to the same and no further patient follow-up is required at this time.  Sari Cogan 03/19/2021, 6:14 PM

## 2021-05-01 ENCOUNTER — Encounter (HOSPITAL_COMMUNITY): Payer: Self-pay | Admitting: Emergency Medicine

## 2021-05-01 ENCOUNTER — Ambulatory Visit (HOSPITAL_COMMUNITY)
Admission: EM | Admit: 2021-05-01 | Discharge: 2021-05-01 | Disposition: A | Discharge status: Home / Self Care | Payer: Self-pay | Attending: Sports Medicine | Admitting: Sports Medicine

## 2021-05-01 ENCOUNTER — Other Ambulatory Visit: Payer: Self-pay

## 2021-05-01 DIAGNOSIS — S61011A Laceration without foreign body of right thumb without damage to nail, initial encounter: Secondary | ICD-10-CM

## 2021-05-01 NOTE — ED Triage Notes (Signed)
Laceration to right thumb, occurred at 10am, was caused by scissors.

## 2021-05-01 NOTE — ED Provider Notes (Signed)
[facility]    CSN: [id number] Arrival date & time: 05/01/21  1646      History   Chief Complaint Chief Complaint  Patient presents with   Laceration    HPI Colleen Russell is a 41 y.o. female who presents with right thumb laceration.   Laceration Associated symptoms: no fever    Patient presents today with a laceration to her right thumb which happened this morning around 10 AM.  She was attempting to cut her hair when the scissors slipped and her hand and cut the radial aspect of her mid thumb.  She was bleeding for a few minutes but then achieved hemostasis with direct pressure.  She has some minor pain, although it is improved.  She presents today for wound repair.  She denies any difficulty moving the thumb, although slightly painful.  Previously had Tdap administered on 10/22/2013.  Past Medical History:  Diagnosis Date   Abnormal Pap smear    Depression    no meds, seeing counselor   Diabetes mellitus in pregnancy 2009   History of bladder infections    Shortness of breath    pregnancy related   Trichomonas     Patient Active Problem List   Diagnosis Date Noted   Sepsis (HCC) 12/22/2017   Acute pyelonephritis 12/22/2017   Tobacco abuse 12/22/2017   Nausea and vomiting 12/22/2017    Past Surgical History:  Procedure Laterality Date   CESAREAN SECTION  2013   with BTL    OB History     Gravida  5   Para  3   Term  3   Preterm  0   AB  2   Living  3      SAB  1   IAB  1   Ectopic  0   Multiple  0   Live Births  1            Home Medications    Prior to Admission medications   Medication Sig Start Date End Date Taking? Authorizing Provider  benzonatate (TESSALON) 200 MG capsule Take 1 capsule (200 mg total) by mouth 2 (two) times daily as needed for cough. 07/15/19   Eustace MooreNelson, Yvonne Sue, MD  cefadroxil (DURICEF) 500 MG capsule Take 1 capsule (500 mg total) by mouth 2 (two) times daily. 03/15/21   Prosperi,  Christian H, PA-C  cephALEXin (KEFLEX) 500 MG capsule Take 1 capsule (500 mg total) by mouth 4 (four) times daily. 06/21/19   Mancel BaleWentz, Elliott, MD  ibuprofen (ADVIL,MOTRIN) 200 MG tablet Take 200 mg by mouth every 6 (six) hours as needed for fever, headache, mild pain or moderate pain.    [provider]  ondansetron (ZOFRAN) 4 MG tablet Take 1 tablet (4 mg total) by mouth every 6 (six) hours. 03/15/21   Prosperi, Christian H, PA-C  penicillin v potassium (VEETID) 500 MG tablet Take 1 tablet (500 mg total) by mouth 3 (three) times daily. 01/24/19   Mardella LaymanHagler, Brian, MD  traMADol (ULTRAM) 50 MG tablet Take 1 tablet (50 mg total) by mouth every 6 (six) hours as needed. 01/24/19   Mardella LaymanHagler, Brian, MD    Family History Family History  Problem Relation Age of Onset   Hypertension Mother    Diabetes Sister     Social History Social History   Tobacco Use   Smoking status: Every Day    Packs/day: 0.20    Types: Cigarettes    Last attempt to quit: 01/14/2011  Years since quitting: 10.3   Smokeless tobacco: Never  Substance Use Topics   Alcohol use: No   Drug use: No     Allergies   Patient has no known allergies.   Review of Systems Review of Systems  Constitutional:  Negative for chills and fever.  Skin:  Positive for wound (right thumb).  Neurological:  Negative for dizziness.    Physical Exam Triage Vital Signs ED Triage Vitals  Enc Vitals Group     BP 05/01/21 1735 (!) 144/82     Pulse Rate 05/01/21 1735 85     Resp 05/01/21 1735 16     Temp 05/01/21 1735 98.1 F (36.7 C)     Temp Source 05/01/21 1735 Oral     SpO2 05/01/21 1735 96 %     Weight --      Height --      Head Circumference --      Peak Flow --      Pain Score 05/01/21 1734 3     Pain Loc --      Pain Edu? --      Excl. in GC? --    No data found.  Updated Vital Signs BP (!) 144/82    Pulse 85    Temp 98.1 F (36.7 C) (Oral)    Resp 16    LMP 04/18/2021    SpO2 96%   Physical  Exam Constitutional:      General: She is not in acute distress.    Appearance: Normal appearance. She is not ill-appearing.  HENT:     Head: Normocephalic and atraumatic.  Eyes:     Extraocular Movements: Extraocular movements intact.     Pupils: Pupils are equal, round, and reactive to light.  Pulmonary:     Effort: Pulmonary effort is normal.  Musculoskeletal:        General: Signs of injury present.     Cervical back: Normal range of motion.     Comments: + there is an approximately 2cm vertical incision on the radial aspect of the right thumb with involvement of the dermis and possible subdermis, does not extend into subcutaneous tissue. Skin is able to be reapproximated without widening or gaping.  Skin:    Capillary Refill: Capillary refill takes less than 2 seconds.  Neurological:     General: No focal deficit present.     Mental Status: She is alert.  Psychiatric:        Mood and Affect: Mood normal.     UC Treatments / Results  Labs (all labs ordered are listed, but only abnormal results are displayed) Labs Reviewed - No data to display  EKG   Radiology No results found.  Procedures Laceration Repair  Date/Time: 05/01/2021 6:05 PM Performed by: Madelyn Brunner, DO Authorized by: Madelyn Brunner, DO   Consent:    Consent obtained:  Verbal   Consent given by:  Patient   Risks, benefits, and alternatives were discussed: yes     Risks discussed:  Infection, pain and poor wound healing Universal protocol:    Procedure explained and questions answered to patient or proxy's satisfaction: yes     Patient identity confirmed:  Verbally with patient and arm band Anesthesia:    Anesthesia method:  None Laceration details:    Location:  Finger   Finger location:  R thumb   Length (cm):  2   Depth (mm):  1 Exploration:    Limited defect created (wound extended): no  Hemostasis achieved with:  Direct pressure   Contaminated: no   Treatment:    Area cleansed with:   Povidone-iodine   Amount of cleaning:  Standard   Irrigation solution:  Sterile saline   Irrigation volume:  20cc x 2   Irrigation method:  Pressure wash and syringe   Visualized foreign bodies/material removed: no     Debridement:  None   Layers/structures repaired:  Deep dermal/superficial fascia Skin repair:    Repair method:  Tissue adhesive and Steri-Strips   Number of Steri-Strips:  3 Approximation:    Approximation:  Close Repair type:    Repair type:  Simple Post-procedure details:    Dressing:  Non-adherent dressing   Procedure completion:  Tolerated Comments:     Closed with dermabond with approximation of skin edges, 3 steri-strips applied over top. Band-aid applied.  (including critical care time)  Medications Ordered in UC Medications - No data to display  Initial Impression / Assessment and Plan / UC Course  I have reviewed the triage vital signs and the nursing notes.  Pertinent labs & imaging results that were available during my care of the patient were reviewed by me and considered in my medical decision making (see chart for details).     Thumb laceration, simple 2 cm -wound was copiously irrigated with providone swabs and irrigated x 2 with sterile saline.  After cleaning the wound, it was dried.  The wound was then closed with good skin reapproximation with Dermabond with 3 separate Steri-Strips laying over top for good skin closure.  This was then covered with a Band-Aid.  Wound care and instructions were provided for the patient.  She is agreeable and understanding this plan.  Return precautions were provided such as redness or signs of infection.  She is safe for discharge home, she may follow-up with her PCP if needed.  Upon chart review, she is up-to-date on her tetanus having her last one back in 2015. Final Clinical Impressions(s) / UC Diagnoses   Final diagnoses:  Laceration of right thumb without damage to nail, foreign body presence unspecified,  initial encounter   Discharge Instructions   None    ED Prescriptions   None    PDMP not reviewed this encounter.   Madelyn Brunner, DO 05/01/21 1813

## 2021-05-15 ENCOUNTER — Ambulatory Visit (HOSPITAL_COMMUNITY): Payer: Self-pay

## 2021-07-15 DIAGNOSIS — Z419 Encounter for procedure for purposes other than remedying health state, unspecified: Secondary | ICD-10-CM | POA: Diagnosis not present

## 2021-08-14 DIAGNOSIS — Z419 Encounter for procedure for purposes other than remedying health state, unspecified: Secondary | ICD-10-CM | POA: Diagnosis not present

## 2021-09-14 DIAGNOSIS — Z419 Encounter for procedure for purposes other than remedying health state, unspecified: Secondary | ICD-10-CM | POA: Diagnosis not present

## 2021-10-14 DIAGNOSIS — Z419 Encounter for procedure for purposes other than remedying health state, unspecified: Secondary | ICD-10-CM | POA: Diagnosis not present

## 2021-10-31 ENCOUNTER — Emergency Department (HOSPITAL_COMMUNITY)
Admission: EM | Admit: 2021-10-31 | Discharge: 2021-10-31 | Disposition: A | Discharge status: Home / Self Care | Payer: Medicaid Other | Attending: Emergency Medicine | Admitting: Emergency Medicine

## 2021-10-31 ENCOUNTER — Emergency Department (HOSPITAL_COMMUNITY): Payer: Medicaid Other

## 2021-10-31 ENCOUNTER — Encounter (HOSPITAL_COMMUNITY): Payer: Self-pay | Admitting: Pharmacy Technician

## 2021-10-31 ENCOUNTER — Other Ambulatory Visit: Payer: Self-pay

## 2021-10-31 DIAGNOSIS — W010XXA Fall on same level from slipping, tripping and stumbling without subsequent striking against object, initial encounter: Secondary | ICD-10-CM | POA: Diagnosis not present

## 2021-10-31 DIAGNOSIS — S99922A Unspecified injury of left foot, initial encounter: Secondary | ICD-10-CM | POA: Diagnosis not present

## 2021-10-31 MED ORDER — ONDANSETRON 4 MG PO TBDP
4.0000 mg | ORAL_TABLET | Freq: Once | ORAL | Status: AC | PRN
Start: 2021-10-31 — End: 2021-10-31
  Administered 2021-10-31: 4 mg via ORAL
  Filled 2021-10-31: qty 1

## 2021-10-31 MED ORDER — OXYCODONE-ACETAMINOPHEN 5-325 MG PO TABS
1.0000 | ORAL_TABLET | ORAL | Status: DC | PRN
  Administered 2021-10-31: 1 via ORAL
  Filled 2021-10-31: qty 1

## 2021-10-31 NOTE — ED Provider Notes (Signed)
[facility] EMERGENCY DEPARTMENT Provider Note   CSN: [id number] Arrival date & time: 10/31/21  1310     History  Chief Complaint  Patient presents with   Foot Pain    Colleen Russell is a 41 y.o. female who presents emergency department for evaluation of left foot pain that occurred 1 week ago.  Patient states that pain is the worst in her middle to left toes.  She states that last week, she tripped over her sandal, falling forward.  No head injury or loss of consciousness.  Since then, she has had pain of the third digit of her left toe with difficulty walking.  She has taken ibuprofen and Tylenol without significant improvement in symptoms.  She denies numbness and tingling.  She has no other complaints.   Foot Pain       Home Medications Prior to Admission medications   Medication Sig Start Date End Date Taking? Authorizing Provider  benzonatate (TESSALON) 200 MG capsule Take 1 capsule (200 mg total) by mouth 2 (two) times daily as needed for cough. 07/15/19   Eustace Moore, MD  cefadroxil (DURICEF) 500 MG capsule Take 1 capsule (500 mg total) by mouth 2 (two) times daily. 03/15/21   Prosperi, Christian H, PA-C  cephALEXin (KEFLEX) 500 MG capsule Take 1 capsule (500 mg total) by mouth 4 (four) times daily. 06/21/19   Mancel Bale, MD  ibuprofen (ADVIL,MOTRIN) 200 MG tablet Take 200 mg by mouth every 6 (six) hours as needed for fever, headache, mild pain or moderate pain.    [provider]  ondansetron (ZOFRAN) 4 MG tablet Take 1 tablet (4 mg total) by mouth every 6 (six) hours. 03/15/21   Prosperi, Christian H, PA-C  penicillin v potassium (VEETID) 500 MG tablet Take 1 tablet (500 mg total) by mouth 3 (three) times daily. 01/24/19   Mardella Layman, MD  traMADol (ULTRAM) 50 MG tablet Take 1 tablet (50 mg total) by mouth every 6 (six) hours as needed. 01/24/19   Mardella Layman, MD      Allergies    Patient has no known allergies.    Review of  Systems   Review of Systems  Musculoskeletal:  Positive for arthralgias and gait problem.  Neurological:  Negative for numbness.    Physical Exam Updated Vital Signs BP 114/72 (BP Location: Right Arm)   Pulse 87   Temp 98 F (36.7 C) (Oral)   Resp 16   SpO2 100%  Physical Exam Vitals and nursing note reviewed.  Constitutional:      General: She is not in acute distress.    Appearance: She is not ill-appearing.  HENT:     Head: Atraumatic.  Eyes:     Conjunctiva/sclera: Conjunctivae normal.  Cardiovascular:     Rate and Rhythm: Normal rate and regular rhythm.     Pulses: Normal pulses.     Heart sounds: No murmur heard. Pulmonary:     Effort: Pulmonary effort is normal. No respiratory distress.     Breath sounds: Normal breath sounds.  Abdominal:     General: Abdomen is flat. There is no distension.     Palpations: Abdomen is soft.     Tenderness: There is no abdominal tenderness.  Musculoskeletal:        General: Normal range of motion.     Cervical back: Normal range of motion.     Comments: Mild swelling noted base of middle phalanx on left foot. TTP. Good cap refill.  Some tenderness noted with foot squeeze.   2+ DP pulses. Full ROM of toes and ankle  Skin:    General: Skin is warm and dry.     Capillary Refill: Capillary refill takes less than 2 seconds.  Neurological:     General: No focal deficit present.     Mental Status: She is alert.  Psychiatric:        Mood and Affect: Mood normal.     ED Results / Procedures / Treatments   Labs (all labs ordered are listed, but only abnormal results are displayed) Labs Reviewed - No data to display  EKG None  Radiology DG Foot Complete Left  Result Date: 10/31/2021 CLINICAL DATA:  Left foot pain after fall EXAM: LEFT FOOT - COMPLETE 3+ VIEW COMPARISON:  None Available. FINDINGS: Bony resorption of the terminal tuft of the third distal phalanx with adjacent osseous fragments or ossification. Mild soft tissue  swelling about the third digit. No additional osseous abnormality. No dislocation. IMPRESSION: Nonspecific terminal tuft resorption of the distal third phalanx can be seen in the setting of trauma. Electronically Signed   By: Minerva Fester M.D.   On: 10/31/2021 16:33    Procedures Procedures    Medications Ordered in ED Medications  oxyCODONE-acetaminophen (PERCOCET/ROXICET) 5-325 MG per tablet 1 tablet (1 tablet Oral Given 10/31/21 1546)  ondansetron (ZOFRAN-ODT) disintegrating tablet 4 mg (4 mg Oral Given 10/31/21 1545)    ED Course/ Medical Decision Making/ A&P                           Medical Decision Making Amount and/or Complexity of Data Reviewed Radiology: ordered.  Risk Prescription drug management.   Presents in no acute distress and is nontoxic-appearing.  Tenderness noted to the base of the third digit over the left foot.  Good capillary refill.  Neurovascularly intact otherwise.  I ordered interpreted x-ray which shows some resorption of the terminal tuft of the distal third phalanx of her left foot.  I agree with radiologist interpretation. Discussed case with my attending Dr Rubin Payor and given that patient does not show evidence of cellulitis or osteomyelitis, will give her post op shoe, crutches and follow up with orthopedics. RICE protocol indicated. Pt expresses understanding and is amenable to plan. DC home in good condition   Final Clinical Impression(s) / ED Diagnoses Final diagnoses:  Toe injury, left, initial encounter    Rx / DC Orders ED Discharge Orders     None         Delight Ovens 10/31/21 Wynelle Fanny, MD 10/31/21 2235

## 2021-10-31 NOTE — ED Triage Notes (Signed)
Pt here with ongoing L foot pain after falling a week ago.

## 2021-10-31 NOTE — Discharge Instructions (Signed)
Your xray was negative for fractures, but there does appear to be some resorption of the distal part of your third toe - this means that some of the bone has essentially "eroded" away. This can be seen with trauma. I have given you a referral to an orthopedic physician - please follow up with his office later this week.

## 2021-11-14 DIAGNOSIS — Z419 Encounter for procedure for purposes other than remedying health state, unspecified: Secondary | ICD-10-CM | POA: Diagnosis not present

## 2022-03-23 ENCOUNTER — Ambulatory Visit (HOSPITAL_COMMUNITY)
Admission: EM | Admit: 2022-03-23 | Discharge: 2022-03-23 | Disposition: A | Discharge status: Home / Self Care | Payer: Medicaid Other | Attending: Internal Medicine | Admitting: Internal Medicine

## 2022-03-23 ENCOUNTER — Encounter (HOSPITAL_COMMUNITY): Payer: Self-pay

## 2022-03-23 DIAGNOSIS — M25531 Pain in right wrist: Secondary | ICD-10-CM

## 2022-03-23 DIAGNOSIS — G5601 Carpal tunnel syndrome, right upper limb: Secondary | ICD-10-CM

## 2022-03-23 MED ORDER — PREDNISONE 20 MG PO TABS
ORAL_TABLET | ORAL | Status: AC
  Filled 2022-03-23: qty 3

## 2022-03-23 MED ORDER — PREDNISONE 20 MG PO TABS
60.0000 mg | ORAL_TABLET | Freq: Once | ORAL | Status: AC
  Administered 2022-03-23: 60 mg via ORAL

## 2022-03-23 MED ORDER — PREDNISONE 20 MG PO TABS: 40.0000 mg | ORAL_TABLET | Freq: Every day | ORAL | 0 refills | Status: AC

## 2022-03-23 NOTE — ED Triage Notes (Signed)
Pt reports right hand pain for several days. Pt report pain wakes her up at night.

## 2022-03-23 NOTE — Discharge Instructions (Addendum)
You have a carpal tunnel syndrome flare up. We gave you a dose of prednisone here in the clinic to help reduce inflammation to your right wrist contributing to your pain. Take Prednisone 40mg  (2 pills) once daily with breakfast starting tomorrow morning to reduce inflammation further. Do not take any NSAID medications while taking prednisone.  Schedule a follow-up appointment with the orthopedic provider listed on your paperwork  Wear the wrist brace daily and nightly. This will help to provide compression and to stabilize the wrist. Apply ice and elevate the right upper extremity as well to reduce inflammation.  If you develop any new or worsening symptoms or do not improve in the next 2 to 3 days, please return.  If your symptoms are severe, please go to the emergency room.  Follow-up with your primary care provider for further evaluation and management of your symptoms as well as ongoing wellness visits.  I hope you feel better!

## 2022-03-23 NOTE — ED Provider Notes (Signed)
[facility]    CSN: [id number] Arrival date & time: 03/23/22  1431      History   Chief Complaint Chief Complaint  Patient presents with   Hand Problem    HPI Colleen Russell is a 41 y.o. female.   Patient presents urgent care for evaluation of right hand, wrist, and arm pain that she attributes to her chronic carpal tunnel but worsened 3 days ago.  Patient states she experiences significant pain with attempting to grip something with the right hand and at rest.  Pain is a burning shooting sensation and currently a 10 on a scale of 0-10.  She has been taking Advil, ibuprofen, and Tylenol but states "nothing works".  She took one of her mother's Percocets today and attempt to help with the pain but this did not help either.  No recent steroid use.  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  Pain is interrupting her ability to perform her job duties and her activities of daily living.  No recent falls, trauma, or injury to the right arm.  Denies right arm weakness, numbness and tingling to the right upper extremity or the left upper extremity, swelling of the right upper extremity/wrist, and fever/chills.  She states she has to have her arm in a very specific position in order to sleep well at night.  Pain is worse in the morning upon waking and constant throughout the day.  Nothing makes the pain better.  Never seen an orthopedic provider in the past for carpal tunnel syndrome.  Patient is tearful due to pain.     Past Medical History:  Diagnosis Date   Abnormal Pap smear    Depression    no meds, seeing counselor   Diabetes mellitus in pregnancy 2009   History of bladder infections    Shortness of breath    pregnancy related   Trichomonas     Patient Active Problem List   Diagnosis Date Noted   Sepsis (HCC) 12/22/2017   Acute pyelonephritis 12/22/2017   Tobacco abuse 12/22/2017   Nausea and vomiting 12/22/2017     Past Surgical History:  Procedure Laterality Date   CESAREAN SECTION  2013   with BTL    OB History     Gravida  5   Para  3   Term  3   Preterm  0   AB  2   Living  3      SAB  1   IAB  1   Ectopic  0   Multiple  0   Live Births  1            Home Medications    Prior to Admission medications   Medication Sig Start Date End Date Taking? Authorizing Provider  predniSONE (DELTASONE) 20 MG tablet Take 2 tablets (40 mg total) by mouth daily for 5 days. 03/23/22 03/28/22 Yes Nathali Vent, Donavan Burnet, FNP  benzonatate (TESSALON) 200 MG capsule Take 1 capsule (200 mg total) by mouth 2 (two) times daily as needed for cough. 07/15/19   Eustace Moore, MD  cefadroxil (DURICEF) 500 MG capsule Take 1 capsule (500 mg total) by mouth 2 (two) times daily. 03/15/21   Prosperi, Christian H, PA-C  cephALEXin (KEFLEX) 500 MG capsule Take 1 capsule (500 mg total) by mouth 4 (four) times daily. 06/21/19   Mancel Bale, MD  ibuprofen (ADVIL,MOTRIN) 200 MG tablet Take 200  mg by mouth every 6 (six) hours as needed for fever, headache, mild pain or moderate pain.    [provider]  ondansetron (ZOFRAN) 4 MG tablet Take 1 tablet (4 mg total) by mouth every 6 (six) hours. 03/15/21   Prosperi, Christian H, PA-C  penicillin v potassium (VEETID) 500 MG tablet Take 1 tablet (500 mg total) by mouth 3 (three) times daily. 01/24/19   Mardella LaymanHagler, Brian, MD  traMADol (ULTRAM) 50 MG tablet Take 1 tablet (50 mg total) by mouth every 6 (six) hours as needed. 01/24/19   Mardella LaymanHagler, Brian, MD    Family History Family History  Problem Relation Age of Onset   Hypertension Mother    Diabetes Sister     Social History Social History   Tobacco Use   Smoking status: Every Day    Packs/day: 0.20    Types: Cigarettes    Last attempt to quit: 01/14/2011    Years since quitting: 11.1   Smokeless tobacco: Never  Substance Use Topics   Alcohol use: No   Drug use: No     Allergies    Patient has no known allergies.   Review of Systems Review of Systems Per HPI  Physical Exam Triage Vital Signs ED Triage Vitals  Enc Vitals Group     BP 03/23/22 1524 (!) 167/109     Pulse Rate 03/23/22 1524 80     Resp 03/23/22 1524 16     Temp 03/23/22 1524 98.3 F (36.8 C)     Temp Source 03/23/22 1524 Oral     SpO2 03/23/22 1524 100 %     Weight --      Height --      Head Circumference --      Peak Flow --      Pain Score 03/23/22 1528 10     Pain Loc --      Pain Edu? --      Excl. in GC? --    No data found.  Updated Vital Signs BP (!) 167/109 (BP Location: Left Arm)   Pulse 80   Temp 98.3 F (36.8 C) (Oral)   Resp 16   SpO2 100%   Visual Acuity Right Eye Distance:   Left Eye Distance:   Bilateral Distance:    Right Eye Near:   Left Eye Near:    Bilateral Near:     Physical Exam Vitals and nursing note reviewed.  Constitutional:      Appearance: She is not ill-appearing or toxic-appearing.  HENT:     Head: Normocephalic and atraumatic.     Right Ear: Hearing and external ear normal.     Left Ear: Hearing and external ear normal.     Nose: Nose normal.     Mouth/Throat:     Lips: Pink.  Eyes:     General: Lids are normal. Vision grossly intact. Gaze aligned appropriately.     Extraocular Movements: Extraocular movements intact.     Conjunctiva/sclera: Conjunctivae normal.  Pulmonary:     Effort: Pulmonary effort is normal.  Musculoskeletal:     Cervical back: Neck supple.     Comments: Positive Phalen's and Tinel's to the right wrist.  No obvious swelling, ecchymosis, sign of deformity/injury, warmth, erythema, or decreased strength to the right upper extremity.  5/5 grip strength to bilateral upper extremities, sensation intact, +2 radial pulses bilaterally.  Significant tenderness with flexion/extension at the right wrist joint.  No pain to the right elbow or right shoulder.  Significant pain with palpation of the right forearm.  Skin:     General: Skin is warm and dry.     Capillary Refill: Capillary refill takes less than 2 seconds.     Findings: No rash.  Neurological:     General: No focal deficit present.     Mental Status: She is alert and oriented to person, place, and time. Mental status is at baseline.     Cranial Nerves: No dysarthria or facial asymmetry.  Psychiatric:        Mood and Affect: Mood normal.        Speech: Speech normal.        Behavior: Behavior normal.        Thought Content: Thought content normal.        Judgment: Judgment normal.      UC Treatments / Results  Labs (all labs ordered are listed, but only abnormal results are displayed) Labs Reviewed - No data to display  EKG   Radiology No results found.  Procedures Procedures (including critical care time)  Medications Ordered in UC Medications  predniSONE (DELTASONE) tablet 60 mg (60 mg Oral Given 03/23/22 1549)    Initial Impression / Assessment and Plan / UC Course  I have reviewed the triage vital signs and the nursing notes.  Pertinent labs & imaging results that were available during my care of the patient were reviewed by me and considered in my medical decision making (see chart for details).   1.  Carpal tunnel syndrome of right wrist, right wrist pain Presentation is consistent with acute flare of carpal tunnel syndrome and median nerve compression to the right wrist.  Patient tearful and pacing in the room due to severe pain at the right wrist at rest and with movement.  Dose of 60 mg oral prednisone given in clinic.  Patient to start prednisone burst 40 mg once daily for the next 5 days with breakfast starting tomorrow.  Advised to avoid NSAID medications while taking prednisone as it can lead to stomach upset and GI bleeding.  She voices understanding and agreement with this plan.  Patient placed in a wrist brace to the right wrist and is to wear this 24 hours a day to provide support and compression to the right wrist.   Walking referral to orthopedic provider given, patient advised to call them to schedule an appointment for follow-up in the next 1 to 2 weeks for further evaluation and management of carpal tunnel syndrome.  Work note provided.  Rest, ice, compression, and elevation recommended.  Deferred imaging based on stable musculoskeletal exam and hemodynamically stable vital signs/nontraumatic mechanism of injury.   Discussed physical exam and available lab work findings in clinic with patient.  Counseled patient regarding appropriate use of medications and potential side effects for all medications recommended or prescribed today. Discussed red flag signs and symptoms of worsening condition,when to call the PCP office, return to urgent care, and when to seek higher level of care in the emergency department. Patient verbalizes understanding and agreement with plan. All questions answered. Patient discharged in stable condition.    Final Clinical Impressions(s) / UC Diagnoses   Final diagnoses:  Carpal tunnel syndrome of right wrist  Right wrist pain     Discharge Instructions      You have a carpal tunnel syndrome flare up. We gave you a dose of prednisone here in the clinic to help reduce inflammation to your right wrist contributing to your pain. Take  Prednisone 40mg  (2 pills) once daily with breakfast starting tomorrow morning to reduce inflammation further. Do not take any NSAID medications while taking prednisone.  Schedule a follow-up appointment with the orthopedic provider listed on your paperwork  Wear the wrist brace daily and nightly. This will help to provide compression and to stabilize the wrist. Apply ice and elevate the right upper extremity as well to reduce inflammation.  If you develop any new or worsening symptoms or do not improve in the next 2 to 3 days, please return.  If your symptoms are severe, please go to the emergency room.  Follow-up with your primary care provider for  further evaluation and management of your symptoms as well as ongoing wellness visits.  I hope you feel better!   ED Prescriptions     Medication Sig Dispense Auth. Provider   predniSONE (DELTASONE) 20 MG tablet Take 2 tablets (40 mg total) by mouth daily for 5 days. 10 tablet , FNP      PDMP not reviewed this encounter.   Carlisle Beers, Carlisle Beers 03/23/22 [phone]

## 2022-03-27 ENCOUNTER — Ambulatory Visit (INDEPENDENT_AMBULATORY_CARE_PROVIDER_SITE_OTHER): Payer: Medicaid Other | Admitting: Surgery

## 2022-03-27 ENCOUNTER — Ambulatory Visit: Payer: Self-pay

## 2022-03-27 ENCOUNTER — Encounter: Payer: Self-pay | Admitting: Surgery

## 2022-03-27 VITALS — BP 167/96 | HR 73 | Ht 63.0 in | Wt 204.0 lb

## 2022-03-27 DIAGNOSIS — G5691 Unspecified mononeuropathy of right upper limb: Secondary | ICD-10-CM | POA: Diagnosis not present

## 2022-03-27 DIAGNOSIS — M25531 Pain in right wrist: Secondary | ICD-10-CM

## 2022-03-27 NOTE — Progress Notes (Signed)
Office Visit Note   Patient: Colleen Russell           Date of Birth: 04-03-1981           MRN: [id number] Visit Date: 03/27/2022              Requested by: Ginette Otto, [facility] [address] [place],  [place] [address] PCP: Ginette Otto, [facility]   Assessment & Plan: Visit Diagnoses:  1. Pain in right wrist   2. Neuropathy of hand, right     Plan: I will schedule NCV/EMG study right upper extremity to rule out cubital and carpal tunnel syndrome.  After completion of the study I would like patient to follow-up with Dr. Dominica Severin hand surgeon with [facility] to discuss treatment options.  For now she can continue the resplint.  Follow-Up Instructions: No follow-ups on file.   Orders:  Orders Placed This Encounter  Procedures   XR Wrist 2 Views Right   Ambulatory referral to Physical Medicine Rehab   No orders of the defined types were placed in this encounter.     Procedures: No procedures performed   Clinical Data: No additional findings.   Subjective: Chief Complaint  Patient presents with   Right Hand - Pain   Right Wrist - Pain    HPI 41 year old black female who is new patient clinic comes in today with complaints of worsening right hand pain numbness and tingling from carpal tunnel syndrome.  Patient states that she has had multiple visits to the emergency department and has been diagnosed with possible carpal tunnel syndrome.  She has never had NCV/EMG studies or has seen a orthopedic specialist.  Patient is right-hand dominant.  Most of her pain is into the right thumb and index finger along with numbness and tingling.  She has had some pain going back up towards her right elbow.  No complaints of neck pain.     Review of Systems No current complaints of cardiopulmonary GI/GU issues  Objective: Vital Signs: BP (!) 167/96   Pulse 73   Ht 5\' 3"  (1.6 m)   Wt 204 lb (92.5 kg)   BMI 36.14 kg/m   Physical  Exam HENT:     Head: Normocephalic and atraumatic.     Nose: Nose normal.  Eyes:     Extraocular Movements: Extraocular movements intact.  Pulmonary:     Effort: No respiratory distress.  Musculoskeletal:     Comments: Server spine good range of motion.  Right shoulder unremarkable.  Right elbow good range of motion.  Positive Tinel's over the cubital tunnel.  Positive right elbow flexion test.  Negative on the left side.  Neurological:     Mental Status: She is alert and oriented to person, place, and time.  Psychiatric:        Mood and Affect: Mood normal.     Ortho Exam  Specialty Comments:  No specialty comments available.  Imaging: No results found.   PMFS History: Patient Active Problem List   Diagnosis Date Noted   Sepsis (HCC) 12/22/2017   Acute pyelonephritis 12/22/2017   Tobacco abuse 12/22/2017   Nausea and vomiting 12/22/2017   Past Medical History:  Diagnosis Date   Abnormal Pap smear    Depression    no meds, seeing counselor   Diabetes mellitus in pregnancy 2009   History of bladder infections    Shortness of breath    pregnancy related   Trichomonas  Family History  Problem Relation Age of Onset   Hypertension Mother    Diabetes Sister     Past Surgical History:  Procedure Laterality Date   CESAREAN SECTION  2013   with BTL   Social History   Occupational History   Not on file  Tobacco Use   Smoking status: Every Day    Packs/day: 0.20    Types: Cigarettes    Last attempt to quit: 01/14/2011    Years since quitting: 11.2   Smokeless tobacco: Never  Substance and Sexual Activity   Alcohol use: No   Drug use: No   Sexual activity: Yes    Birth control/protection: Surgical

## 2022-03-28 ENCOUNTER — Telehealth: Payer: Self-pay | Admitting: Physical Medicine and Rehabilitation

## 2022-03-28 NOTE — Telephone Encounter (Signed)
Pt called in stating that she was referred to Dr. Alvester Morin to do a nerve study... Pt is requesting an appt.. Pt requesting a callback.Marland KitchenMarland Kitchen

## 2022-03-28 NOTE — Telephone Encounter (Signed)
Spoke with patient and scheduled OV for 03/29/22

## 2022-04-04 ENCOUNTER — Ambulatory Visit (INDEPENDENT_AMBULATORY_CARE_PROVIDER_SITE_OTHER): Payer: Medicaid Other | Admitting: Physical Medicine and Rehabilitation

## 2022-04-04 DIAGNOSIS — G8929 Other chronic pain: Secondary | ICD-10-CM | POA: Diagnosis not present

## 2022-04-04 DIAGNOSIS — R202 Paresthesia of skin: Secondary | ICD-10-CM

## 2022-04-04 DIAGNOSIS — M25511 Pain in right shoulder: Secondary | ICD-10-CM | POA: Diagnosis not present

## 2022-04-04 DIAGNOSIS — M79641 Pain in right hand: Secondary | ICD-10-CM | POA: Diagnosis not present

## 2022-04-04 NOTE — Progress Notes (Signed)
Functional Pain Scale - descriptive words and definitions   Severe (9)  Cannot do any ADL's even with assistance can barely talk/unable to sleep and unable to use distraction. Severe range order  Average Pain 9  Right handed. Pain and numbness in right wrist and hand. Swelling in hand and arm. Pain radiates from shoulder to hand

## 2022-04-07 NOTE — Procedures (Unsigned)
EMG & NCV Findings: All nerve conduction studies (as indicated in the following tables) were within normal limits.    All examined muscles (as indicated in the following table) showed no evidence of electrical instability.    Impression: Essentially NORMAL electrodiagnostic study of the right upper limb.  There is no significant electrodiagnostic evidence of nerve entrapment, brachial plexopathy or cervical radiculopathy.    As you know, purely sensory or demyelinating radiculopathies and chemical radiculitis may not be detected with this particular electrodiagnostic study. **This electrodiagnostic study cannot rule out small fiber polyneuropathy and dysesthesias from central pain syndromes such as stroke or central pain sensitization syndromes such as fibromyalgia.  Myotomal referral pain from trigger points is also not excluded.  Recommendations: 1.  Follow-up with referring physician. 2.  Continue current management of symptoms.  ___________________________ Naaman Plummer FAAPMR Board Certified, American Board of Physical Medicine and Rehabilitation    Nerve Conduction Studies Anti Sensory Summary Table   Stim Site NR Peak (ms) Norm Peak (ms) P-T Amp (V) Norm P-T Amp Site1 Site2 Delta-P (ms) Dist (cm) Vel (m/s) Norm Vel (m/s)  Right Median Acr Palm Anti Sensory (2nd Digit)  31.8C  Wrist    3.6 <3.6 41.5 >10 Wrist Palm 1.6 0.0    Palm    2.0 <2.0 0.1         Right Radial Anti Sensory (Base 1st Digit)  31.5C  Wrist    2.1 <3.1 41.3  Wrist Base 1st Digit 2.1 0.0    Right Ulnar Anti Sensory (5th Digit)  32.1C  Wrist    3.0 <3.7 32.2 >15.0 Wrist 5th Digit 3.0 14.0 47 >38   Motor Summary Table   Stim Site NR Onset (ms) Norm Onset (ms) O-P Amp (mV) Norm O-P Amp Site1 Site2 Delta-0 (ms) Dist (cm) Vel (m/s) Norm Vel (m/s)  Right Median Motor (Abd Poll Brev)  31.4C  Wrist    3.6 <4.2 10.3 >5 Elbow Wrist 3.7 20.5 55 >50  Elbow    7.3  9.9         Right Ulnar Motor (Abd Dig Min)   31.7C  Wrist    2.7 <4.2 8.9 >3 B Elbow Wrist 3.2 19.5 61 >53  B Elbow    5.9  10.4  A Elbow B Elbow 1.2 11.0 92 >53  A Elbow    7.1  10.2          EMG   Side Muscle Nerve Root Ins Act Fibs Psw Amp Dur Poly Recrt Int Dennie Bible Comment  Right Abd Poll Brev Median C8-T1 Nml Nml Nml Nml Nml 0 Nml Nml   Right 1stDorInt Ulnar C8-T1 Nml Nml Nml Nml Nml 0 Nml Nml   Right PronatorTeres Median C6-7 Nml Nml Nml Nml Nml 0 Nml Nml   Right Biceps Musculocut C5-6 Nml Nml Nml Nml Nml 0 Nml Nml   Right Deltoid Axillary C5-6 Nml Nml Nml Nml Nml 0 Nml Nml     Nerve Conduction Studies Anti Sensory Left/Right Comparison   Stim Site L Lat (ms) R Lat (ms) L-R Lat (ms) L Amp (V) R Amp (V) L-R Amp (%) Site1 Site2 L Vel (m/s) R Vel (m/s) L-R Vel (m/s)  Median Acr Palm Anti Sensory (2nd Digit)  31.8C  Wrist  3.6   41.5  Wrist Palm     Palm  2.0   0.1        Radial Anti Sensory (Base 1st Digit)  31.5C  Wrist  2.1  41.3  Wrist Base 1st Digit     Ulnar Anti Sensory (5th Digit)  32.1C  Wrist  3.0   32.2  Wrist 5th Digit  47    Motor Left/Right Comparison   Stim Site L Lat (ms) R Lat (ms) L-R Lat (ms) L Amp (mV) R Amp (mV) L-R Amp (%) Site1 Site2 L Vel (m/s) R Vel (m/s) L-R Vel (m/s)  Median Motor (Abd Poll Brev)  31.4C  Wrist  3.6   10.3  Elbow Wrist  55   Elbow  7.3   9.9        Ulnar Motor (Abd Dig Min)  31.7C  Wrist  2.7   8.9  B Elbow Wrist  61   B Elbow  5.9   10.4  A Elbow B Elbow  92   A Elbow  7.1   10.2           Waveforms:

## 2022-04-11 NOTE — Progress Notes (Signed)
Colleen Russell - 41 y.o. female MRN [id number]  Date of birth: February 19, 1981  Office Visit Note: Visit Date: 04/04/2022 PCP: Lady Gary, [facility] Referred by: Lady Gary, Physicians *  Subjective: Chief Complaint  Patient presents with   Right Shoulder - Pain   Right Wrist - Pain, Numbness, Weakness   Right Hand - Pain, Weakness, Numbness   HPI:  Colleen Russell is a 41 y.o. female who comes in today at the request of Benjiman Core, PA-C for evaluation and management of the Right upper extremities.  Patient is Right hand dominant.  She reports chronic worsening severe 9 out of 10 pain in the right hand with numbness and tingling more in the radial digits but somewhat globally.  Some referral from the shoulder down to the arm.  No frank radicular symptoms in a dermatomal fashion.  No left-sided complaints.  She has not had prior electrodiagnostic studies or cervical spine imaging.  She does note swelling in the hand and difficulty opening objects.  She has an appointment to see Dr. Amedeo Plenty at [facility].   Review of Systems  Musculoskeletal:  Positive for joint pain and neck pain.  Neurological:  Positive for tingling and weakness.  All other systems reviewed and are negative.  Otherwise per HPI.  Assessment & Plan: Visit Diagnoses:    ICD-10-CM   1. Paresthesia of skin  R20.2 NCV with EMG (electromyography)    2. Pain in right hand  M79.641     3. Chronic right shoulder pain  M25.511    G89.29       Plan: Impression: Essentially NORMAL electrodiagnostic study of the right upper limb.  There is no significant electrodiagnostic evidence of nerve entrapment, brachial plexopathy or cervical radiculopathy.    As you know, purely sensory or demyelinating radiculopathies and chemical radiculitis may not be detected with this particular electrodiagnostic study. **This electrodiagnostic study cannot rule out small fiber polyneuropathy and dysesthesias from central pain  syndromes such as stroke or central pain sensitization syndromes such as fibromyalgia.  Myotomal referral pain from trigger points is also not excluded.  Recommendations: 1.  Follow-up with referring physician. 2.  Continue current management of symptoms.  Meds & Orders: No orders of the defined types were placed in this encounter.   Orders Placed This Encounter  Procedures   NCV with EMG (electromyography)    Follow-up: Return for visit to requesting provider as needed.   Procedures: No procedures performed  EMG & NCV Findings: All nerve conduction studies (as indicated in the following tables) were within normal limits.    All examined muscles (as indicated in the following table) showed no evidence of electrical instability.    Impression: Essentially NORMAL electrodiagnostic study of the right upper limb.  There is no significant electrodiagnostic evidence of nerve entrapment, brachial plexopathy or cervical radiculopathy.    As you know, purely sensory or demyelinating radiculopathies and chemical radiculitis may not be detected with this particular electrodiagnostic study. **This electrodiagnostic study cannot rule out small fiber polyneuropathy and dysesthesias from central pain syndromes such as stroke or central pain sensitization syndromes such as fibromyalgia.  Myotomal referral pain from trigger points is also not excluded.  Recommendations: 1.  Follow-up with referring physician. 2.  Continue current management of symptoms.  ___________________________ Wonda Olds Board Certified, American Board of Physical Medicine and Rehabilitation    Nerve Conduction Studies Anti Sensory Summary Table   Stim Site NR Peak (ms) Norm Peak (ms) P-T Amp (V)  Norm P-T Amp Site1 Site2 Delta-P (ms) Dist (cm) Vel (m/s) Norm Vel (m/s)  Right Median Acr Palm Anti Sensory (2nd Digit)  31.8C  Wrist    3.6 <3.6 41.5 >10 Wrist Palm 1.6 0.0    Palm    2.0 <2.0 0.1         Right  Radial Anti Sensory (Base 1st Digit)  31.5C  Wrist    2.1 <3.1 41.3  Wrist Base 1st Digit 2.1 0.0    Right Ulnar Anti Sensory (5th Digit)  32.1C  Wrist    3.0 <3.7 32.2 >15.0 Wrist 5th Digit 3.0 14.0 47 >38   Motor Summary Table   Stim Site NR Onset (ms) Norm Onset (ms) O-P Amp (mV) Norm O-P Amp Site1 Site2 Delta-0 (ms) Dist (cm) Vel (m/s) Norm Vel (m/s)  Right Median Motor (Abd Poll Brev)  31.4C  Wrist    3.6 <4.2 10.3 >5 Elbow Wrist 3.7 20.5 55 >50  Elbow    7.3  9.9         Right Ulnar Motor (Abd Dig Min)  31.7C  Wrist    2.7 <4.2 8.9 >3 B Elbow Wrist 3.2 19.5 61 >53  B Elbow    5.9  10.4  A Elbow B Elbow 1.2 11.0 92 >53  A Elbow    7.1  10.2          EMG   Side Muscle Nerve Root Ins Act Fibs Psw Amp Dur Poly Recrt Int Fraser Din Comment  Right Abd Poll Brev Median C8-T1 Nml Nml Nml Nml Nml 0 Nml Nml   Right 1stDorInt Ulnar C8-T1 Nml Nml Nml Nml Nml 0 Nml Nml   Right PronatorTeres Median C6-7 Nml Nml Nml Nml Nml 0 Nml Nml   Right Biceps Musculocut C5-6 Nml Nml Nml Nml Nml 0 Nml Nml   Right Deltoid Axillary C5-6 Nml Nml Nml Nml Nml 0 Nml Nml     Nerve Conduction Studies Anti Sensory Left/Right Comparison   Stim Site L Lat (ms) R Lat (ms) L-R Lat (ms) L Amp (V) R Amp (V) L-R Amp (%) Site1 Site2 L Vel (m/s) R Vel (m/s) L-R Vel (m/s)  Median Acr Palm Anti Sensory (2nd Digit)  31.8C  Wrist  3.6   41.5  Wrist Palm     Palm  2.0   0.1        Radial Anti Sensory (Base 1st Digit)  31.5C  Wrist  2.1   41.3  Wrist Base 1st Digit     Ulnar Anti Sensory (5th Digit)  32.1C  Wrist  3.0   32.2  Wrist 5th Digit  47    Motor Left/Right Comparison   Stim Site L Lat (ms) R Lat (ms) L-R Lat (ms) L Amp (mV) R Amp (mV) L-R Amp (%) Site1 Site2 L Vel (m/s) R Vel (m/s) L-R Vel (m/s)  Median Motor (Abd Poll Brev)  31.4C  Wrist  3.6   10.3  Elbow Wrist  55   Elbow  7.3   9.9        Ulnar Motor (Abd Dig Min)  31.7C  Wrist  2.7   8.9  B Elbow Wrist  61   B Elbow  5.9   10.4  A Elbow B Elbow   92   A Elbow  7.1   10.2           Waveforms:             Clinical History: No  specialty comments available.     Objective:  VS:  HT:    WT:   BMI:     BP:   HR: bpm  TEMP: ( )  RESP:  Physical Exam Musculoskeletal:        General: No swelling, tenderness or deformity.     Comments: Inspection reveals no atrophy of the bilateral APB or FDI or hand intrinsics. There is no swelling, color changes, allodynia or dystrophic changes. There is 5 out of 5 strength in the bilateral wrist extension, finger abduction and long finger flexion. There is intact sensation to light touch in all dermatomal and peripheral nerve distributions. There is a negative Froment's test bilaterally.  There is a negative Phalen's test bilaterally. There is a negative Hoffmann's test bilaterally.  Skin:    General: Skin is warm and dry.     Findings: No erythema or rash.  Neurological:     General: No focal deficit present.     Mental Status: She is alert and oriented to person, place, and time.     Motor: No weakness or abnormal muscle tone.     Coordination: Coordination normal.  Psychiatric:        Mood and Affect: Mood normal.        Behavior: Behavior normal.      Imaging: No results found.

## 2023-02-26 ENCOUNTER — Ambulatory Visit (HOSPITAL_COMMUNITY)
Admission: EM | Admit: 2023-02-26 | Discharge: 2023-02-26 | Disposition: A | Discharge status: Home / Self Care | Payer: MEDICAID | Attending: Emergency Medicine | Admitting: Emergency Medicine

## 2023-02-26 ENCOUNTER — Encounter (HOSPITAL_COMMUNITY): Payer: Self-pay | Admitting: Emergency Medicine

## 2023-02-26 ENCOUNTER — Other Ambulatory Visit: Payer: Self-pay

## 2023-02-26 DIAGNOSIS — N3000 Acute cystitis without hematuria: Secondary | ICD-10-CM | POA: Diagnosis present

## 2023-02-26 DIAGNOSIS — Z113 Encounter for screening for infections with a predominantly sexual mode of transmission: Secondary | ICD-10-CM | POA: Insufficient documentation

## 2023-02-26 DIAGNOSIS — I10 Essential (primary) hypertension: Secondary | ICD-10-CM | POA: Insufficient documentation

## 2023-02-26 DIAGNOSIS — R112 Nausea with vomiting, unspecified: Secondary | ICD-10-CM | POA: Diagnosis present

## 2023-02-26 LAB — POCT URINALYSIS DIP (MANUAL ENTRY)
Bilirubin, UA: NEGATIVE
Glucose, UA: NEGATIVE mg/dL
Ketones, POC UA: NEGATIVE mg/dL
Nitrite, UA: NEGATIVE
Protein Ur, POC: NEGATIVE mg/dL
Spec Grav, UA: 1.025 (ref 1.010–1.025)
Urobilinogen, UA: 0.2 U/dL
pH, UA: 6 (ref 5.0–8.0)

## 2023-02-26 LAB — POCT URINE PREGNANCY: Preg Test, Ur: NEGATIVE

## 2023-02-26 MED ORDER — NITROFURANTOIN MONOHYD MACRO 100 MG PO CAPS: 100.0000 mg | ORAL_CAPSULE | Freq: Two times a day (BID) | ORAL | 0 refills | Status: AC

## 2023-02-26 MED ORDER — ONDANSETRON HCL 4 MG PO TABS: 4.0000 mg | ORAL_TABLET | Freq: Three times a day (TID) | ORAL | 0 refills | Status: AC | PRN

## 2023-02-26 NOTE — ED Triage Notes (Signed)
Pt endorse lower abd pain and urinary discomfort for the past few days, concerned about UTI and STD.

## 2023-02-26 NOTE — Discharge Instructions (Addendum)
Your lab results will be available within 24 hours.  I have also ordered a urine culture as based on her symptoms and treating you for possible UTI.  Start Macrobid 100 mg twice daily for 3 days while awaiting on the results of urine culture.  Of also prescribed to Zofran to help with the nausea.  If your symptoms become severe go immediately to the nearest emergency department.  Our office will contact you if your lab results require any further treatment.  Your blood pressure is elevated today please call your primary care doctor to discuss restarting your blood pressure medications.  Blood pressure today is 183/96.

## 2023-02-26 NOTE — ED Provider Notes (Signed)
[facility]    CSN: [id number] Arrival date & time: 02/26/23  7564      History   Chief Complaint Chief Complaint  Patient presents with   Dysuria    HPI Colleen Russell is a 42 y.o. female.    Dysuria Associated symptoms: nausea   Patient presents today for evaluation of 2 days of dysuria symptoms, lower abdominal discomfort and pressure and 2 episodes of nausea with vomiting.  She denies chills, fever.  She is also concerned for possible STD as her partner had advised her to "get checked".  On evaluation the patient blood pressure is elevated.  She reports a history of hypertension but stopped taking her blood pressure medication as her PCP had instructed her to.  She reports she is unable to get an appointment with her PCP until next month.  Patient denies any symptoms associated with high blood pressure and endorses that where that pressure had been elevated. Past Medical History:  Diagnosis Date   Abnormal Pap smear    Depression    no meds, seeing counselor   Diabetes mellitus in pregnancy 2009   History of bladder infections    Shortness of breath    pregnancy related   Trichomonas     Patient Active Problem List   Diagnosis Date Noted   Sepsis (HCC) 12/22/2017   Acute pyelonephritis 12/22/2017   Tobacco abuse 12/22/2017   Nausea and vomiting 12/22/2017    Past Surgical History:  Procedure Laterality Date   CESAREAN SECTION  2013   with BTL    OB History     Gravida  5   Para  3   Term  3   Preterm  0   AB  2   Living  3      SAB  1   IAB  1   Ectopic  0   Multiple  0   Live Births  1            Home Medications    Prior to Admission medications   Medication Sig Start Date End Date Taking? Authorizing Provider  nitrofurantoin, macrocrystal-monohydrate, (MACROBID) 100 MG capsule Take 1 capsule (100 mg total) by mouth 2 (two) times daily for 3 days. 02/26/23 03/01/23 Yes Bing Neighbors, NP  ondansetron  (ZOFRAN) 4 MG tablet Take 1 tablet (4 mg total) by mouth every 8 (eight) hours as needed for nausea or vomiting. 02/26/23  Yes Bing Neighbors, NP  ibuprofen (ADVIL,MOTRIN) 200 MG tablet Take 200 mg by mouth every 6 (six) hours as needed for fever, headache, mild pain or moderate pain.    [provider]  traMADol (ULTRAM) 50 MG tablet Take 1 tablet (50 mg total) by mouth every 6 (six) hours as needed. 01/24/19   Mardella Layman, MD    Family History Family History  Problem Relation Age of Onset   Hypertension Mother    Diabetes Sister     Social History Social History   Tobacco Use   Smoking status: Every Day    Current packs/day: 0.00    Types: Cigarettes    Last attempt to quit: 01/14/2011    Years since quitting: 12.1   Smokeless tobacco: Never  Substance Use Topics   Alcohol use: No   Drug use: No     Allergies   Patient has no known allergies.   Review of Systems Review of Systems  Gastrointestinal:  Positive for nausea.  Genitourinary:  Positive for dysuria.  Physical Exam Triage Vital Signs ED Triage Vitals  Encounter Vitals Group     BP 02/26/23 0829 (!) 183/96     Systolic BP Percentile --      Diastolic BP Percentile --      Pulse Rate 02/26/23 0829 72     Resp 02/26/23 0829 18     Temp 02/26/23 0829 98.1 F (36.7 C)     Temp Source 02/26/23 0829 Oral     SpO2 02/26/23 0829 98 %     Weight --      Height --      Head Circumference --      Peak Flow --      Pain Score 02/26/23 0830 4     Pain Loc --      Pain Education --      Exclude from Growth Chart --    No data found.  Updated Vital Signs BP (!) 180/90 (BP Location: Right Arm)   Pulse 72   Temp 98.1 F (36.7 C) (Oral)   Resp 18   SpO2 98%   Visual Acuity Right Eye Distance:   Left Eye Distance:   Bilateral Distance:    Right Eye Near:   Left Eye Near:    Bilateral Near:     Physical Exam Constitutional:      Appearance: Normal appearance.  HENT:     Head:  Normocephalic.  Eyes:     Extraocular Movements: Extraocular movements intact.     Pupils: Pupils are equal, round, and reactive to light.  Cardiovascular:     Rate and Rhythm: Normal rate and regular rhythm.  Pulmonary:     Effort: Pulmonary effort is normal.     Breath sounds: Normal breath sounds.  Abdominal:     General: Bowel sounds are normal.     Palpations: Abdomen is soft.     Tenderness: There is no right CVA tenderness, left CVA tenderness, guarding or rebound.  Musculoskeletal:        General: Normal range of motion.     Cervical back: Normal range of motion and neck supple.  Neurological:     General: No focal deficit present.     Mental Status: She is alert.      UC Treatments / Results  Labs (all labs ordered are listed, but only abnormal results are displayed) Labs Reviewed  POCT URINALYSIS DIP (MANUAL ENTRY) - Abnormal; Notable for the following components:      Result Value   Clarity, UA cloudy (*)    Blood, UA trace-intact (*)    Leukocytes, UA Small (1+) (*)    All other components within normal limits  URINE CULTURE  HIV ANTIBODY (ROUTINE TESTING W REFLEX)  RPR  POCT URINE PREGNANCY  CERVICOVAGINAL ANCILLARY ONLY    EKG   Radiology No results found.  Procedures Procedures (including critical care time)  Medications Ordered in UC Medications - No data to display  Initial Impression / Assessment and Plan / UC Course  I have reviewed the triage vital signs and the nursing notes.  Pertinent labs & imaging results that were available during my care of the patient were reviewed by me and considered in my medical decision making (see chart for details).    UA significant for small lutes however given patient's symptoms and history of pyelonephritis  will cover with MicroBid while awaiting urine culture and STD lab results.  Patient also wanted HIV and RPR testing which have been collected today.  Patient's blood pressure is elevated and she has  scheduled follow-up with primary care advised to follow up as soon as possible to ensure this is not a chronic problem as she has been diagnosed with hypertension in the past.  Return precautions given if symptoms worsen or do not improve.  Patient also advised that our office will only contact her if  labs result  are abnormal and require treatment. Final Clinical Impressions(s) / UC Diagnoses   Final diagnoses:  Acute cystitis without hematuria  Screen for STD (sexually transmitted disease)  Nausea and vomiting, unspecified vomiting type  Elevated blood pressure reading in office with diagnosis of hypertension     Discharge Instructions      Your lab results will be available within 24 hours.  I have also ordered a urine culture as based on her symptoms and treating you for possible UTI.  Start Macrobid 100 mg twice daily for 3 days while awaiting on the results of urine culture.  Of also prescribed to Zofran to help with the nausea.  If your symptoms become severe go immediately to the nearest emergency department.  Our office will contact you if your lab results require any further treatment.  Your blood pressure is elevated today please call your primary care doctor to discuss restarting your blood pressure medications.  Blood pressure today is 183/96.     ED Prescriptions     Medication Sig Dispense Auth. Provider   nitrofurantoin, macrocrystal-monohydrate, (MACROBID) 100 MG capsule Take 1 capsule (100 mg total) by mouth 2 (two) times daily for 3 days. 6 capsule Bing Neighbors, NP   ondansetron (ZOFRAN) 4 MG tablet Take 1 tablet (4 mg total) by mouth every 8 (eight) hours as needed for nausea or vomiting. 20 tablet Bing Neighbors, NP      PDMP not reviewed this encounter.   Bing Neighbors, NP 02/26/23 [phone]

## 2023-02-27 ENCOUNTER — Telehealth: Payer: Self-pay

## 2023-02-27 LAB — CERVICOVAGINAL ANCILLARY ONLY
Chlamydia: NEGATIVE
Comment: NEGATIVE
Comment: NEGATIVE
Comment: NORMAL
Neisseria Gonorrhea: NEGATIVE
Trichomonas: POSITIVE — AB

## 2023-02-27 LAB — URINE CULTURE
Culture: >=100000 — AB
Special Requests: NORMAL

## 2023-02-27 MED ORDER — METRONIDAZOLE 500 MG PO TABS: 500.0000 mg | ORAL_TABLET | Freq: Two times a day (BID) | ORAL | 0 refills | Status: AC

## 2023-02-27 NOTE — Telephone Encounter (Signed)
Contacted patient by phone.  Verified identity using two identifiers.  Provided positive result.  Reviewed safe sex practices, notifying partners, and refraining from sexual activities for 7 days from time of treatment.  Patient verified understanding, all questions answered.     Per protocol, pt requires tx with metronidazole. Reviewed with patient, verified pharmacy, prescription sent.

## 2023-03-05 ENCOUNTER — Ambulatory Visit (HOSPITAL_BASED_OUTPATIENT_CLINIC_OR_DEPARTMENT_OTHER): Payer: MEDICAID | Admitting: Family Medicine

## 2023-03-19 ENCOUNTER — Ambulatory Visit (HOSPITAL_BASED_OUTPATIENT_CLINIC_OR_DEPARTMENT_OTHER): Payer: MEDICAID | Admitting: Family Medicine

## 2023-07-08 ENCOUNTER — Encounter (HOSPITAL_COMMUNITY): Payer: Self-pay

## 2023-07-08 ENCOUNTER — Other Ambulatory Visit: Payer: Self-pay

## 2023-07-08 ENCOUNTER — Emergency Department (HOSPITAL_COMMUNITY)
Admission: EM | Admit: 2023-07-08 | Discharge: 2023-07-08 | Disposition: A | Discharge status: Home / Self Care | Payer: MEDICAID | Attending: Student | Admitting: Student

## 2023-07-08 DIAGNOSIS — J020 Streptococcal pharyngitis: Secondary | ICD-10-CM | POA: Insufficient documentation

## 2023-07-08 DIAGNOSIS — J029 Acute pharyngitis, unspecified: Secondary | ICD-10-CM | POA: Diagnosis present

## 2023-07-08 LAB — CBC WITH DIFFERENTIAL/PLATELET
Abs Immature Granulocytes: 0.08 10*3/uL — ABNORMAL HIGH (ref 0.00–0.07)
Basophils Absolute: 0 10*3/uL (ref 0.0–0.1)
Basophils Relative: 0 %
Eosinophils Absolute: 0 10*3/uL (ref 0.0–0.5)
Eosinophils Relative: 0 %
HCT: 41.3 % (ref 36.0–46.0)
Hemoglobin: 14 g/dL (ref 12.0–15.0)
Immature Granulocytes: 1 %
Lymphocytes Relative: 12 %
Lymphs Abs: 2 10*3/uL (ref 0.7–4.0)
MCH: 31.7 pg (ref 26.0–34.0)
MCHC: 33.9 g/dL (ref 30.0–36.0)
MCV: 93.4 fL (ref 80.0–100.0)
Monocytes Absolute: 0.9 10*3/uL (ref 0.1–1.0)
Monocytes Relative: 5 %
Neutro Abs: 13.6 10*3/uL — ABNORMAL HIGH (ref 1.7–7.7)
Neutrophils Relative %: 82 %
Platelets: 355 10*3/uL (ref 150–400)
RBC: 4.42 MIL/uL (ref 3.87–5.11)
RDW: 12.8 % (ref 11.5–15.5)
WBC: 16.6 10*3/uL — ABNORMAL HIGH (ref 4.0–10.5)
nRBC: 0 % (ref 0.0–0.2)

## 2023-07-08 LAB — BASIC METABOLIC PANEL
Anion gap: 11 (ref 5–15)
BUN: 5 mg/dL — ABNORMAL LOW (ref 6–20)
CO2: 23 mmol/L (ref 22–32)
Calcium: 9.3 mg/dL (ref 8.9–10.3)
Chloride: 102 mmol/L (ref 98–111)
Creatinine, Ser: 0.93 mg/dL (ref 0.44–1.00)
GFR, Estimated: >60 mL/min (ref >60)
Glucose, Bld: 118 mg/dL — ABNORMAL HIGH (ref 70–99)
Potassium: 3.7 mmol/L (ref 3.5–5.1)
Sodium: 136 mmol/L (ref 135–145)

## 2023-07-08 LAB — GROUP A STREP BY PCR: Group A Strep by PCR: DETECTED — AB

## 2023-07-08 LAB — SARS CORONAVIRUS 2 BY RT PCR: SARS Coronavirus 2 by RT PCR: NEGATIVE

## 2023-07-08 MED ORDER — ACETAMINOPHEN 500 MG PO TABS
1000.0000 mg | ORAL_TABLET | Freq: Once | ORAL | Status: AC
Start: 2023-07-08 — End: 2023-07-08
  Administered 2023-07-08: 1000 mg via ORAL
  Filled 2023-07-08: qty 2

## 2023-07-08 MED ORDER — PENICILLIN G BENZATHINE 1200000 UNIT/2ML IM SUSY: 1.2000 10*6.[IU] | PREFILLED_SYRINGE | Freq: Once | INTRAMUSCULAR | Status: DC

## 2023-07-08 MED ORDER — AMOXICILLIN 500 MG PO CAPS: 500.0000 mg | ORAL_CAPSULE | Freq: Once | ORAL | Status: DC

## 2023-07-08 MED ORDER — DEXAMETHASONE 4 MG PO TABS
10.0000 mg | ORAL_TABLET | Freq: Once | ORAL | Status: AC
  Administered 2023-07-08: 10 mg via ORAL
  Filled 2023-07-08: qty 3

## 2023-07-08 MED ORDER — PENICILLIN G BENZATHINE 1200000 UNIT/2ML IM SUSY
1.2000 10*6.[IU] | PREFILLED_SYRINGE | Freq: Once | INTRAMUSCULAR | Status: AC
  Administered 2023-07-08: 1.2 10*6.[IU] via INTRAMUSCULAR
  Filled 2023-07-08: qty 2

## 2023-07-08 MED ORDER — AMOXICILLIN 500 MG PO CAPS: 500.0000 mg | ORAL_CAPSULE | Freq: Two times a day (BID) | ORAL | 0 refills | Status: DC

## 2023-07-08 NOTE — ED Notes (Signed)
 ..  The patient is A&OX4, ambulatory at d/c with independent steady gait, NAD. Pt verbalized understanding of d/c instructions and follow up care.

## 2023-07-08 NOTE — ED Provider Notes (Signed)
 [place] EMERGENCY DEPARTMENT AT [facility] Provider Note   CSN: [id number] Arrival date & time: 07/08/23  1746     History  Chief Complaint  Patient presents with   Sore Throat    Colleen Russell is a 43 y.o. female, no pertinent past medical history, who presents to the ED secondary to sore throat, body ache, and fever this been going on for the last day.  She states she just feels very rundown, and her throat hurts.  She has had fevers of 101+.  Unknown when last taken Tylenol.  Denies any abdominal pain, chest pain, shortness of breath, or rashes.    Home Medications Prior to Admission medications   Medication Sig Start Date End Date Taking? Authorizing Provider  ibuprofen (ADVIL,MOTRIN) 200 MG tablet Take 200 mg by mouth every 6 (six) hours as needed for fever, headache, mild pain or moderate pain.    [provider]  ondansetron (ZOFRAN) 4 MG tablet Take 1 tablet (4 mg total) by mouth every 8 (eight) hours as needed for nausea or vomiting. 02/26/23   Bing Neighbors, NP  traMADol (ULTRAM) 50 MG tablet Take 1 tablet (50 mg total) by mouth every 6 (six) hours as needed. 01/24/19   Mardella Layman, MD      Allergies    Patient has no known allergies.    Review of Systems   Review of Systems  HENT:  Positive for sore throat.   Respiratory:  Negative for shortness of breath.     Physical Exam Updated Vital Signs BP (!) 146/89 (BP Location: Left Arm)   Pulse 95   Temp (!) 101.1 F (38.4 C)   Resp 17   Ht 5\' 3"  (1.6 m)   Wt 92.5 kg   SpO2 99%   BMI 36.12 kg/m  Physical Exam Vitals and nursing note reviewed.  Constitutional:      General: She is not in acute distress.    Appearance: She is well-developed.  HENT:     Head: Normocephalic and atraumatic.     Mouth/Throat:     Mouth: Mucous membranes are moist.     Pharynx: Oropharyngeal exudate present.     Tonsils: 3+ on the right. 3+ on the left.  Eyes:     Conjunctiva/sclera:  Conjunctivae normal.  Cardiovascular:     Rate and Rhythm: Normal rate and regular rhythm.     Heart sounds: No murmur heard. Pulmonary:     Effort: Pulmonary effort is normal. No respiratory distress.     Breath sounds: Normal breath sounds.  Abdominal:     Palpations: Abdomen is soft.     Tenderness: There is no abdominal tenderness.  Musculoskeletal:        General: No swelling.     Cervical back: Neck supple.  Skin:    General: Skin is warm and dry.     Capillary Refill: Capillary refill takes less than 2 seconds.  Neurological:     Mental Status: She is alert.  Psychiatric:        Mood and Affect: Mood normal.     ED Results / Procedures / Treatments   Labs (all labs ordered are listed, but only abnormal results are displayed) Labs Reviewed  GROUP A STREP BY PCR - Abnormal; Notable for the following components:      Result Value   Group A Strep by PCR DETECTED (*)    All other components within normal limits  BASIC METABOLIC PANEL -  Abnormal; Notable for the following components:   Glucose, Bld 118 (*)    BUN 5 (*)    All other components within normal limits  CBC WITH DIFFERENTIAL/PLATELET - Abnormal; Notable for the following components:   WBC 16.6 (*)    Neutro Abs 13.6 (*)    Abs Immature Granulocytes 0.08 (*)    All other components within normal limits  SARS CORONAVIRUS 2 BY RT PCR    EKG None  Radiology No results found.  Procedures Procedures    Medications Ordered in ED Medications  dexamethasone (DECADRON) tablet 10 mg (has no administration in time range)  penicillin g benzathine (BICILLIN LA) 1200000 UNIT/2ML injection 1.2 Million Units (has no administration in time range)  acetaminophen (TYLENOL) tablet 1,000 mg (1,000 mg Oral Given 07/08/23 1802)    ED Course/ Medical Decision Making/ A&P                                 Medical Decision Making Patient is a 43 year old female, who has been feeling bad, for the last day.  She states  she has a sore throat, pain with swallowing, and fatigue.  She has tonsillar exudates on exam.  Will give her dexamethasone, and test her for strep, obtain labs, labs show leukocytosis, strep positive.  Able to tolerate p.o. intake.  Dexamethasone ordered, for comfort  Amount and/or Complexity of Data Reviewed Labs: ordered.    Details: Strep positive, leukocytosis 16K Discussion of management or test interpretation with external provider(s): See above, she has no evidence of any kind of airway compromise, is overall well-appearing, given shot of the pen G, and to follow-up with PCP.  Discussed importance of letting others know, or of her illness and not sharing food or drink, with others until 24 to 48 hours after antibiotics.  Risk OTC drugs. Prescription drug management.    Final Clinical Impression(s) / ED Diagnoses Final diagnoses:  Strep pharyngitis    Rx / DC Orders ED Discharge Orders          Ordered    amoxicillin (AMOXIL) 500 MG capsule  2 times daily,   Status:  Discontinued        07/08/23 2030              Orah Sonnen, Harley Alto, PA 07/08/23 2036    Glendora Score, MD 07/11/23 1350

## 2023-07-08 NOTE — Discharge Instructions (Addendum)
 You have strep throat, you are contagious for the 24 to 48 hours, after you take you receive your antibiotics.  Please let other people know, not to share drinks, with you or utensils.  Make sure you are taking Tylenol and ibuprofen for your fever.  Return to the ER if you have any difficulty breathing, worsening symptoms.  Your symptoms should improve in the next 24 to 48 hours.

## 2023-07-08 NOTE — ED Triage Notes (Signed)
 Pt c/o sore throat, dysphagia, runny nose and bodyaches started yesterday.

## 2023-10-12 ENCOUNTER — Encounter (HOSPITAL_COMMUNITY): Payer: Self-pay

## 2023-10-12 ENCOUNTER — Other Ambulatory Visit: Payer: Self-pay

## 2023-10-12 ENCOUNTER — Emergency Department (HOSPITAL_COMMUNITY): Payer: MEDICAID

## 2023-10-12 ENCOUNTER — Emergency Department (HOSPITAL_COMMUNITY)
Admission: EM | Admit: 2023-10-12 | Discharge: 2023-10-12 | Disposition: A | Discharge status: Home / Self Care | Payer: MEDICAID | Attending: Emergency Medicine | Admitting: Emergency Medicine

## 2023-10-12 DIAGNOSIS — S93492A Sprain of other ligament of left ankle, initial encounter: Secondary | ICD-10-CM

## 2023-10-12 DIAGNOSIS — M25572 Pain in left ankle and joints of left foot: Secondary | ICD-10-CM | POA: Insufficient documentation

## 2023-10-12 DIAGNOSIS — Y9301 Activity, walking, marching and hiking: Secondary | ICD-10-CM | POA: Diagnosis not present

## 2023-10-12 DIAGNOSIS — X501XXA Overexertion from prolonged static or awkward postures, initial encounter: Secondary | ICD-10-CM | POA: Insufficient documentation

## 2023-10-12 MED ORDER — OXYCODONE-ACETAMINOPHEN 5-325 MG PO TABS
1.0000 | ORAL_TABLET | ORAL | Status: DC | PRN
  Administered 2023-10-12: 1 via ORAL
  Filled 2023-10-12: qty 1

## 2023-10-12 NOTE — Progress Notes (Signed)
 Orthopedic Tech Progress Note Patient Details:  Colleen Russell 11-15-1980 [id number]  Ortho Devices Type of Ortho Device: ASO, Crutches Ortho Device/Splint Location: LLE Ortho Device/Splint Interventions: Application, Adjustment   Post Interventions Patient Tolerated: Well Instructions Provided: Adjustment of device, Poper ambulation with device  [name] E [name] 10/12/2023, 10:41 AM

## 2023-10-12 NOTE — ED Notes (Signed)
 Brace placed by othro tech, crutch education by ortho tech, pt states that she is a little queezy after pain med, offered crackers, pt states that she feels better and is ready to go home, pt verbalized understanding d/c and follow up, pt from department

## 2023-10-12 NOTE — ED Triage Notes (Signed)
 Pt states she walking to bathroom and tripped over a shoe and twisted left ankle. Pt states she has a burning sensation in ankle. Pt states she is unable to move toes d/t swelling and pain.

## 2023-10-12 NOTE — ED Notes (Signed)
 Pt in bed, pt reports decreased pain.  Pt has ice pack in place, pt c/o pain and swelling to L ankle.

## 2023-10-12 NOTE — Discharge Instructions (Signed)
 Take 4 over the counter ibuprofen  tablets 3 times a day or 2 over-the-counter naproxen  tablets twice a day for pain. Also take tylenol  1000mg (2 extra strength) four times a day.   Follow-up with your doctor hopefully in about a week.  If you are still having significant discomfort they may choose to perform further imaging or send you to sports medicine or physical therapy or orthopedics.

## 2023-10-12 NOTE — ED Provider Notes (Signed)
 [place] EMERGENCY DEPARTMENT AT [facility] Provider Note   CSN: [id number] Arrival date & time: 10/12/23  0825     Patient presents with: Ankle Pain   Colleen Russell is a 43 y.o. female.   43 yo F with a chief complaint of left ankle pain.  She was walking to the bathroom this morning and she had stepped on a shoe and rolled her ankle.  Complaining of pain through the entire leg but worse at the lateral aspect of the ankle.  Feels like it burns.  She denies any other specific injury.  Denies back pain.   Ankle Pain      Prior to Admission medications   Medication Sig Start Date End Date Taking? Authorizing Provider  ibuprofen  (ADVIL ,MOTRIN ) 200 MG tablet Take 200 mg by mouth every 6 (six) hours as needed for fever, headache, mild pain or moderate pain.    [provider]  ondansetron  (ZOFRAN ) 4 MG tablet Take 1 tablet (4 mg total) by mouth every 8 (eight) hours as needed for nausea or vomiting. 02/26/23   Arloa Suzen RAMAN, NP  traMADol  (ULTRAM ) 50 MG tablet Take 1 tablet (50 mg total) by mouth every 6 (six) hours as needed. 01/24/19   Rolinda Rogue, MD    Allergies: Patient has no known allergies.    Review of Systems  Updated Vital Signs BP (!) 150/97   Pulse 87   Temp 97.7 F (36.5 C)   Resp 18   Ht 5' 4 (1.626 m)   Wt 119.3 kg   LMP 09/15/2023 (Approximate)   SpO2 93%   BMI 45.14 kg/m   Physical Exam Vitals and nursing note reviewed.  Constitutional:      General: She is not in acute distress.    Appearance: She is well-developed. She is not diaphoretic.  HENT:     Head: Normocephalic and atraumatic.   Eyes:     Pupils: Pupils are equal, round, and reactive to light.    Cardiovascular:     Rate and Rhythm: Normal rate and regular rhythm.     Heart sounds: No murmur heard.    No friction rub. No gallop.  Pulmonary:     Effort: Pulmonary effort is normal.     Breath sounds: No wheezing or rales.  Abdominal:     General:  There is no distension.     Palpations: Abdomen is soft.     Tenderness: There is no abdominal tenderness.   Musculoskeletal:        General: Swelling and tenderness present.     Cervical back: Normal range of motion and neck supple.     Comments: Pain and swelling about the lateral malleolus.  There is some pain at the anterior talofibular ligament.  No obvious pain at the base of the fifth metatarsal or the navicular.  She does have some lateral leg pain that extends up but no obvious pain at the fibular neck.  No obvious midline spinal tenderness develops or deformities.  Pulse motor and sensation intact distally.   Skin:    General: Skin is warm and dry.   Neurological:     Mental Status: She is alert and oriented to person, place, and time.   Psychiatric:        Behavior: Behavior normal.     (all labs ordered are listed, but only abnormal results are displayed) Labs Reviewed - No data to display  EKG: None  Radiology: DG Ankle Complete Left Result  Date: 10/12/2023 CLINICAL DATA:  Ankle pain after tripping over shoe and twisting left ankle. EXAM: LEFT ANKLE COMPLETE - 3+ VIEW COMPARISON:  None Available. FINDINGS: Lateral soft tissue swelling. No signs of acute fracture or dislocation. No significant arthropathy. IMPRESSION: Lateral soft tissue swelling. No fracture identified. Electronically Signed   By: Waddell Calk M.D.   On: 10/12/2023 09:32     Procedures   Medications Ordered in the ED  oxyCODONE -acetaminophen  (PERCOCET/ROXICET) 5-325 MG per tablet 1 tablet (1 tablet Oral Given 10/12/23 0845)                                    Medical Decision Making Amount and/or Complexity of Data Reviewed Radiology: ordered.  Risk Prescription drug management.   43 yo F with a chief complaints of left ankle pain.  Patient had an inversion injury.  Plain film of the ankle independently interpreted by me without fracture or dislocation.  Placed in an ASO crutches PCP  follow-up.  10:39 AM:  I have discussed the diagnosis/risks/treatment options with the patient.  Evaluation and diagnostic testing in the emergency department does not suggest an emergent condition requiring admission or immediate intervention beyond what has been performed at this time.  They will follow up with PCP. We also discussed returning to the ED immediately if new or worsening sx occur. We discussed the sx which are most concerning (e.g., sudden worsening pain, fever, inability to tolerate by mouth) that necessitate immediate return. Medications administered to the patient during their visit and any new prescriptions provided to the patient are listed below.  Medications given during this visit Medications  oxyCODONE -acetaminophen  (PERCOCET/ROXICET) 5-325 MG per tablet 1 tablet (1 tablet Oral Given 10/12/23 0845)     The patient appears reasonably screen and/or stabilized for discharge and I doubt any other medical condition or other [facility] requiring further screening, evaluation, or treatment in the ED at this time prior to discharge.       Final diagnoses:  None    ED Discharge Orders     None          Emil Share, DO 10/12/23 1039

## 2023-10-24 ENCOUNTER — Emergency Department (HOSPITAL_COMMUNITY): Payer: MEDICAID

## 2023-10-24 ENCOUNTER — Other Ambulatory Visit: Payer: Self-pay

## 2023-10-24 ENCOUNTER — Encounter (HOSPITAL_COMMUNITY): Payer: Self-pay

## 2023-10-24 ENCOUNTER — Emergency Department (HOSPITAL_COMMUNITY)
Admission: EM | Admit: 2023-10-24 | Discharge: 2023-10-24 | Disposition: A | Discharge status: Home / Self Care | Payer: MEDICAID

## 2023-10-24 DIAGNOSIS — M25572 Pain in left ankle and joints of left foot: Secondary | ICD-10-CM | POA: Diagnosis present

## 2023-10-24 DIAGNOSIS — X501XXD Overexertion from prolonged static or awkward postures, subsequent encounter: Secondary | ICD-10-CM | POA: Insufficient documentation

## 2023-10-24 DIAGNOSIS — S93492D Sprain of other ligament of left ankle, subsequent encounter: Secondary | ICD-10-CM | POA: Insufficient documentation

## 2023-10-24 DIAGNOSIS — S93402D Sprain of unspecified ligament of left ankle, subsequent encounter: Secondary | ICD-10-CM

## 2023-10-24 MED ORDER — NAPROXEN 500 MG PO TABS: 500.0000 mg | ORAL_TABLET | Freq: Two times a day (BID) | ORAL | 0 refills | Status: AC

## 2023-10-24 MED ORDER — NAPROXEN 250 MG PO TABS
500.0000 mg | ORAL_TABLET | Freq: Once | ORAL | Status: AC
  Administered 2023-10-24: 500 mg via ORAL
  Filled 2023-10-24: qty 2

## 2023-10-24 NOTE — Discharge Instructions (Addendum)
 Take Naprosyn  twice daily with food to help with pain and inflammation.  Elevate the ankle is much as possible and apply ice for 20 minutes at a time.  You can bear weight on the ankle as tolerated.  Please call to schedule follow-up appointment with orthopedics.

## 2023-10-24 NOTE — ED Provider Notes (Signed)
 [place] EMERGENCY DEPARTMENT AT [facility] Provider Note   CSN: [id number] Arrival date & time: 10/24/23  1257     Patient presents with: Foot Injury   Colleen Russell is a 43 y.o. female.   Colleen Russell is a 43 y.o. female with history of an ankle injury, who presents to the ED for ongoing pain and swelling to the left ankle.  She was seen initially on 6/28 after she rolled her ankle leading to pain and swelling of the ankle.  She was seen and had negative x-rays and was treated with an ASO brace and crutches for ankle sprain.  She reports that despite using that she feels that has not gotten better.  She lost her discharge paperwork and was unable to find the phone number to follow-up with orthopedics.  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  She feels like the ASO brace does not give her enough support.  She has been sitting with her ankle up on the couch but has not prompted up with pillows and has taken ibuprofen  1-2 times daily but reports she has hard time taking it more often than that and has not used ice much to help.  No new injuries to the ankle since the initial incident  The history is provided by the patient and medical records.  Foot Injury Associated symptoms: no fever        Prior to Admission medications   Medication Sig Start Date End Date Taking? Authorizing Provider  ibuprofen  (ADVIL ,MOTRIN ) 200 MG tablet Take 200 mg by mouth every 6 (six) hours as needed for fever, headache, mild pain or moderate pain.    [provider]  ondansetron  (ZOFRAN ) 4 MG tablet Take 1 tablet (4 mg total) by mouth every 8 (eight) hours as needed for nausea or vomiting. 02/26/23   Arloa Suzen RAMAN, NP  traMADol  (ULTRAM ) 50 MG tablet Take 1 tablet (50 mg total) by mouth every 6 (six) hours as needed. 01/24/19   Rolinda Rogue, MD    Allergies: Patient has no known  allergies.    Review of Systems  Constitutional:  Negative for chills and fever.  Musculoskeletal:  Positive for arthralgias and joint swelling.    Updated Vital Signs Temp (!) 97.5 F (36.4 C) (Oral)   Wt 119.3 kg   LMP 09/15/2023 (Approximate)   BMI 45.14 kg/m   Physical Exam Vitals and nursing note reviewed.  Constitutional:      General: She is not in acute distress.    Appearance: Normal appearance. She is well-developed. She is not ill-appearing or diaphoretic.  HENT:     Head: Normocephalic and atraumatic.  Eyes:     General:        Right eye: No discharge.        Left eye: No discharge.  Pulmonary:     Effort: Pulmonary effort is normal. No respiratory distress.  Musculoskeletal:     Comments: Tenderness and swelling over the lateral malleolus of the left ankle without overlying bruising, no tenderness present at the distal foot or heel.  DP and PT pulses 2+, pain with dorsi and plantarflexion.  No pain proximal to the ankle or at the knee.  Neurological:     Mental Status: She is alert and oriented to person, place, and time.     Coordination: Coordination normal.  Psychiatric:  Mood and Affect: Mood normal.        Behavior: Behavior normal.     (all labs ordered are listed, but only abnormal results are displayed) Labs Reviewed - No data to display  EKG: None  Radiology: DG Ankle Complete Left Result Date: 10/24/2023 CLINICAL DATA:  Left ankle pain and swelling without known injury. EXAM: LEFT ANKLE COMPLETE - 3+ VIEW COMPARISON:  May 14, 2023. FINDINGS: There is no evidence of fracture, dislocation, or joint effusion. There is no evidence of arthropathy or other focal bone abnormality. Soft tissues are unremarkable. IMPRESSION: Negative. Electronically Signed   By: Lynwood Landy Raddle M.D.   On: 10/24/2023 13:30     Procedures   Medications Ordered in the ED - No data to display                                  Medical Decision  Making Risk Prescription drug management.   43 year old female presents with ongoing left ankle pain after sprain on 6/28, repeat x-ray today still shows some swelling but no evidence of fracture.  Will place in cam walker boot to provide more support, provided education on using ice, and elevation to help.  Will also start patient on Naprosyn  to help provide more consistent anti-inflammatory effect while only taking medication twice daily instead of ibuprofen .  Stressed the importance of close follow-up with orthopedics if ankle sprain is still not improving.  Provided work note to help give her a few days off her feet     Final diagnoses:  Sprain of left ankle, unspecified ligament, subsequent encounter    ED Discharge Orders     None          Alva Larraine JULIANNA DEVONNA 10/30/23 2118    Ula Prentice SAUNDERS, MD 11/04/23 [phone]

## 2023-10-24 NOTE — ED Triage Notes (Signed)
 Pt arrives via POV. PT reports worsening pain and swelling to left ankle/foot. She was seen on 06/28 after injuring her ankle. She denies any new injury. Pt reports she returned to work too soon.

## 2023-10-24 NOTE — ED Triage Notes (Signed)
 Pt concerned of poss fracture/ fall x2 since initial accident

## 2023-11-25 ENCOUNTER — Ambulatory Visit
Admission: RE | Admit: 2023-11-25 | Discharge: 2023-11-25 | Disposition: A | Discharge status: Home / Self Care | Payer: MEDICAID | Source: Ambulatory Visit | Attending: Infectious Diseases | Admitting: Infectious Diseases

## 2023-11-25 ENCOUNTER — Other Ambulatory Visit: Payer: Self-pay | Admitting: Infectious Diseases

## 2023-11-25 DIAGNOSIS — R7611 Nonspecific reaction to tuberculin skin test without active tuberculosis: Secondary | ICD-10-CM

## 2024-01-12 ENCOUNTER — Emergency Department (HOSPITAL_COMMUNITY): Payer: MEDICAID

## 2024-01-12 ENCOUNTER — Encounter (HOSPITAL_COMMUNITY): Payer: Self-pay

## 2024-01-12 ENCOUNTER — Emergency Department (HOSPITAL_COMMUNITY)
Admission: EM | Admit: 2024-01-12 | Discharge: 2024-01-12 | Disposition: A | Discharge status: Home / Self Care | Payer: MEDICAID | Attending: Emergency Medicine | Admitting: Emergency Medicine

## 2024-01-12 DIAGNOSIS — K209 Esophagitis, unspecified without bleeding: Secondary | ICD-10-CM | POA: Insufficient documentation

## 2024-01-12 DIAGNOSIS — D72829 Elevated white blood cell count, unspecified: Secondary | ICD-10-CM | POA: Insufficient documentation

## 2024-01-12 DIAGNOSIS — R059 Cough, unspecified: Secondary | ICD-10-CM | POA: Insufficient documentation

## 2024-01-12 DIAGNOSIS — R079 Chest pain, unspecified: Secondary | ICD-10-CM | POA: Diagnosis present

## 2024-01-12 LAB — HCG, SERUM, QUALITATIVE: Preg, Serum: NEGATIVE

## 2024-01-12 LAB — BASIC METABOLIC PANEL WITH GFR
Anion gap: 11 (ref 5–15)
BUN: 10 mg/dL (ref 6–20)
CO2: 21 mmol/L — ABNORMAL LOW (ref 22–32)
Calcium: 9 mg/dL (ref 8.9–10.3)
Chloride: 107 mmol/L (ref 98–111)
Creatinine, Ser: 0.97 mg/dL (ref 0.44–1.00)
GFR, Estimated: >60 mL/min (ref >60)
Glucose, Bld: 117 mg/dL — ABNORMAL HIGH (ref 70–99)
Potassium: 3.9 mmol/L (ref 3.5–5.1)
Sodium: 139 mmol/L (ref 135–145)

## 2024-01-12 LAB — CBC
HCT: 42.7 % (ref 36.0–46.0)
Hemoglobin: 14.1 g/dL (ref 12.0–15.0)
MCH: 31.8 pg (ref 26.0–34.0)
MCHC: 33 g/dL (ref 30.0–36.0)
MCV: 96.2 fL (ref 80.0–100.0)
Platelets: 371 K/uL (ref 150–400)
RBC: 4.44 MIL/uL (ref 3.87–5.11)
RDW: 12.8 % (ref 11.5–15.5)
WBC: 12.2 K/uL — ABNORMAL HIGH (ref 4.0–10.5)
nRBC: 0 % (ref 0.0–0.2)

## 2024-01-12 LAB — HEPATIC FUNCTION PANEL
ALT: 10 U/L (ref 0–44)
AST: 17 U/L (ref 15–41)
Albumin: 3.4 g/dL — ABNORMAL LOW (ref 3.5–5.0)
Alkaline Phosphatase: 58 U/L (ref 38–126)
Bilirubin, Direct: 0.2 mg/dL (ref 0.0–0.2)
Indirect Bilirubin: 0.2 mg/dL — ABNORMAL LOW (ref 0.3–0.9)
Total Bilirubin: 0.4 mg/dL (ref 0.0–1.2)
Total Protein: 5.9 g/dL — ABNORMAL LOW (ref 6.5–8.1)

## 2024-01-12 LAB — GROUP A STREP BY PCR: Group A Strep by PCR: NOT DETECTED

## 2024-01-12 LAB — RESP PANEL BY RT-PCR (RSV, FLU A&B, COVID)  RVPGX2
Influenza A by PCR: NEGATIVE
Influenza B by PCR: NEGATIVE
Resp Syncytial Virus by PCR: NEGATIVE
SARS Coronavirus 2 by RT PCR: NEGATIVE

## 2024-01-12 LAB — TROPONIN I (HIGH SENSITIVITY)
Troponin I (High Sensitivity): 2 ng/L (ref <18)
Troponin I (High Sensitivity): 4 ng/L (ref <18)

## 2024-01-12 LAB — MAGNESIUM: Magnesium: 1.9 mg/dL (ref 1.7–2.4)

## 2024-01-12 LAB — LIPASE, BLOOD: Lipase: 20 U/L (ref 11–51)

## 2024-01-12 MED ORDER — ALUM & MAG HYDROXIDE-SIMETH 200-200-20 MG/5ML PO SUSP
30.0000 mL | Freq: Once | ORAL | Status: AC
  Administered 2024-01-12: 30 mL via ORAL
  Filled 2024-01-12: qty 30

## 2024-01-12 MED ORDER — PANTOPRAZOLE SODIUM 20 MG PO TBEC
20.0000 mg | DELAYED_RELEASE_TABLET | Freq: Every day | ORAL | 1 refills | Status: AC
Start: 2024-01-12 — End: ?

## 2024-01-12 MED ORDER — SUCRALFATE 1 G PO TABS: 1.0000 g | ORAL_TABLET | Freq: Three times a day (TID) | ORAL | 1 refills | Status: AC

## 2024-01-12 NOTE — Discharge Instructions (Addendum)
 You have been provided prescriptions for pantoprazole as well as for sucralfate.  As we discussed continue to use these and schedule an appointment with your referred to the referred gastroenterology specialist.  If anything becomes worse despite ongoing therapy, or you have any bright red vomitus or anything that appears dark and coffee-ground appearing in the vomitus, please seek emergent reassessment evaluation.  Otherwise, follow-up with your primary care and with GI to fully assess your condition and best manage going forward.

## 2024-01-12 NOTE — ED Notes (Signed)
 ED Provider at bedside.

## 2024-01-12 NOTE — ED Triage Notes (Signed)
 Pt came in POV d/t cp and sore throat that started this morning. Pt described the cp as a burning sensation. The cp is all over.Coughing makes it worse. No n/v. Pt stated she does feel sob. A&O X4.

## 2024-01-12 NOTE — ED Provider Notes (Signed)
 [place] [facility] AT [facility] Provider Note   CSN: [id number] Arrival date & time: 01/12/24  1533     Patient presents with: Chest Pain and Sore Throat   Colleen Russell is a 43 y.o. female who presents to the ED today out of concern for a burning sensation in the center of her chest as well as sore throat, increased mucus that she is been expectorating with cough, denies have any nasal congestion, denies any dyspnea, denies any nausea or vomiting.  Symptoms began last night and have progressively worsened until her presentation today.  Denies having any previous history of GERD, denies having any previous cardiac history.    Chest Pain Associated symptoms: cough   Sore Throat Associated symptoms include chest pain.       Prior to Admission medications   Medication Sig Start Date End Date Taking? Authorizing Provider  pantoprazole (PROTONIX) 20 MG tablet Take 1 tablet (20 mg total) by mouth daily. 01/12/24  Yes Myriam Dorn BROCKS, PA  sucralfate (CARAFATE) 1 g tablet Take 1 tablet (1 g total) by mouth 4 (four) times daily -  with meals and at bedtime. 01/12/24  Yes Myriam Dorn BROCKS, PA  ibuprofen  (ADVIL ,MOTRIN ) 200 MG tablet Take 200 mg by mouth every 6 (six) hours as needed for fever, headache, mild pain or moderate pain.    [provider]  naproxen  (NAPROSYN ) 500 MG tablet Take 1 tablet (500 mg total) by mouth 2 (two) times daily. 10/24/23   Kehrli, Kelsey F, PA-C  ondansetron  (ZOFRAN ) 4 MG tablet Take 1 tablet (4 mg total) by mouth every 8 (eight) hours as needed for nausea or vomiting. 02/26/23   Arloa Suzen RAMAN, NP  traMADol  (ULTRAM ) 50 MG tablet Take 1 tablet (50 mg total) by mouth every 6 (six) hours as needed. 01/24/19   Rolinda Rogue, MD    Allergies: Patient has no known allergies.    Review of Systems  Respiratory:  Positive for cough.   Cardiovascular:  Positive for chest pain.    Updated Vital Signs BP (!) 153/98    Pulse 81   Temp 98.4 F (36.9 C) (Oral)   Resp 16   SpO2 100%   Physical Exam  (all labs ordered are listed, but only abnormal results are displayed) Labs Reviewed  BASIC METABOLIC PANEL WITH GFR - Abnormal; Notable for the following components:      Result Value   CO2 21 (*)    Glucose, Bld 117 (*)    All other components within normal limits  CBC - Abnormal; Notable for the following components:   WBC 12.2 (*)    All other components within normal limits  HEPATIC FUNCTION PANEL - Abnormal; Notable for the following components:   Total Protein 5.9 (*)    Albumin 3.4 (*)    Indirect Bilirubin 0.2 (*)    All other components within normal limits  RESP PANEL BY RT-PCR (RSV, FLU A&B, COVID)  [id number]  GROUP A STREP BY PCR  HCG, SERUM, QUALITATIVE  LIPASE, BLOOD  MAGNESIUM  TROPONIN I (HIGH SENSITIVITY)  TROPONIN I (HIGH SENSITIVITY)    EKG: EKG Interpretation Date/Time:  Sunday January 12 2024 15:44:55 EDT Ventricular Rate:  82 PR Interval:  140 QRS Duration:  78 QT Interval:  372 QTC Calculation: 434 R Axis:   71  Text Interpretation: Normal sinus rhythm Confirmed by Melvenia Motto [phone]) on 01/12/2024 4:14:30 PM  Radiology: DG Chest 2 View Result Date: 01/12/2024 CLINICAL  DATA:  Chest pain. EXAM: CHEST - 2 VIEW COMPARISON:  Radiograph 11/25/2023 FINDINGS: The cardiomediastinal contours are normal. The lungs are clear. Pulmonary vasculature is normal. No consolidation, pleural effusion, or pneumothorax. No acute osseous abnormalities are seen. IMPRESSION: No active cardiopulmonary disease. Electronically Signed   By: Andrea Gasman M.D.   On: 01/12/2024 16:36     Procedures   Medications Ordered in the ED  alum & mag hydroxide-simeth (MAALOX/MYLANTA) 200-200-20 MG/5ML suspension 30 mL (30 mLs Oral Given 01/12/24 1912)                                    Medical Decision Making Amount and/or Complexity of Data Reviewed Labs: ordered. Radiology:  ordered.  Risk OTC drugs.   Medical Decision Making:   Colleen Russell is a 43 y.o. female who presented to the ED today with sore throat and burning sensation of the chest detailed above.     Complete initial physical exam performed, notably the patient  was alert and oriented in no apparent distress.  Physical exam was largely unremarkable.    Reviewed and confirmed nursing documentation for past medical history, family history, social history.    Initial Assessment:   With the patient's presentation of sore throat chest pain, differential includes ACS, pulmonary etiology of chest pain, high suspicion for esophagitis given the burning sensation that rises up in her throat with increased mucus production.  Initial Plan:  Secondary to sore throat obtain strep swab as well as nasopharyngeal swab Screening labs including CBC and Metabolic panel to evaluate for infectious or metabolic etiology of disease.  Include serum magnesium Assess hCG to evaluate for pregnancy secondary to pharmacotherapy safety as well as radioimaging safety. CXR to evaluate for structural/infectious intrathoracic pathology.  EKG and serial troponin to evaluate for cardiac pathology. Objective evaluation as below reviewed   Initial Study Results:   Laboratory  All laboratory results reviewed without evidence of clinically relevant pathology.   Exceptions include: Mild leukocytosis of 12.2  EKG EKG was reviewed independently. Rate, rhythm, axis, intervals all examined and without medically relevant abnormality. ST segments without concerns for elevations.    Radiology:  All images reviewed independently. Agree with radiology report at this time.   DG Chest 2 View Result Date: 01/12/2024 CLINICAL DATA:  Chest pain. EXAM: CHEST - 2 VIEW COMPARISON:  Radiograph 11/25/2023 FINDINGS: The cardiomediastinal contours are normal. The lungs are clear. Pulmonary vasculature is normal. No consolidation, pleural effusion,  or pneumothorax. No acute osseous abnormalities are seen. IMPRESSION: No active cardiopulmonary disease. Electronically Signed   By: Andrea Gasman M.D.   On: 01/12/2024 16:36     Reassessment and Plan:   On reevaluation of the patient, she has received a GI cocktail which she states has improved her symptoms.  Review of objective data obtained does not show any increase in the troponin, chest x-ray is unremarkable as is the EKG.  Effectively rule out cardiac etiology of her chest pain.  Chest x-ray is unremarkable, lung sounds are clear and equal bilaterally making suspicion for pulmonary etiology very low.  Also, strep swab and nasopharyngeal viral panel is negative.  She does have a mild leukocytosis however there is no other systemic symptoms of infection, she does have mildly elevated blood pressure, discussed with the patient to follow-up with primary care with this.  Given her resolution with GI cocktail, suspect this is secondary to esophagitis/GERD  and will start her on a course of pantoprazole as well as sucralfate.  Will also give her outpatient follow-up with gastroenterology for further evaluation of this condition.  This was discussed with the patient in depth, she understands and agrees has no further concerns at this time.  As vital signs have remained stable within normal limits, and cardiac and pulmonary workup is reassuring, there is no signs of esophageal bleed or rupture, will find if she is stable for discharge and outpatient management.       Final diagnoses:  Esophagitis    ED Discharge Orders          Ordered    pantoprazole (PROTONIX) 20 MG tablet  Daily        01/12/24 1948    sucralfate (CARAFATE) 1 g tablet  3 times daily with meals & bedtime        01/12/24 1948               Myriam Dorn BROCKS, PA 01/12/24 1950    Melvenia Motto, MD 01/12/24 2035
